# Patient Record
Sex: Female | Born: 1955 | Hispanic: No | Marital: Married | Smoking: Never smoker
Health system: Southern US, Community
[De-identification: ages and names within clinical notes are randomized; demographics above are authoritative.]

## PROBLEM LIST (undated history)

## (undated) DIAGNOSIS — D6859 Other primary thrombophilia: Secondary | ICD-10-CM

## (undated) HISTORY — PX: DILATION AND CURETTAGE OF UTERUS: SHX78

## (undated) HISTORY — DX: Other primary thrombophilia: D68.59

---

## 2012-07-05 ENCOUNTER — Emergency Department (HOSPITAL_BASED_OUTPATIENT_CLINIC_OR_DEPARTMENT_OTHER): Payer: BC Managed Care – PPO

## 2012-07-05 ENCOUNTER — Emergency Department (HOSPITAL_BASED_OUTPATIENT_CLINIC_OR_DEPARTMENT_OTHER)
Admission: EM | Admit: 2012-07-05 | Discharge: 2012-07-05 | Disposition: A | Discharge status: Home / Self Care | Payer: BC Managed Care – PPO | Attending: Emergency Medicine | Admitting: Emergency Medicine

## 2012-07-05 DIAGNOSIS — Y92410 Unspecified street and highway as the place of occurrence of the external cause: Secondary | ICD-10-CM | POA: Insufficient documentation

## 2012-07-05 DIAGNOSIS — S0083XA Contusion of other part of head, initial encounter: Secondary | ICD-10-CM

## 2012-07-05 DIAGNOSIS — S8990XA Unspecified injury of unspecified lower leg, initial encounter: Secondary | ICD-10-CM | POA: Insufficient documentation

## 2012-07-05 DIAGNOSIS — S0003XA Contusion of scalp, initial encounter: Secondary | ICD-10-CM | POA: Insufficient documentation

## 2012-07-05 DIAGNOSIS — Y9389 Activity, other specified: Secondary | ICD-10-CM | POA: Insufficient documentation

## 2012-07-05 MED ORDER — CYCLOBENZAPRINE HCL 10 MG PO TABS: 10.0000 mg | ORAL_TABLET | Freq: Two times a day (BID) | ORAL | Status: DC | PRN

## 2012-07-05 MED ORDER — IBUPROFEN 400 MG PO TABS
600.0000 mg | ORAL_TABLET | Freq: Once | ORAL | Status: AC
  Administered 2012-07-05: 600 mg via ORAL
  Filled 2012-07-05: qty 1
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
## 2016-04-21 HISTORY — PX: COLONOSCOPY: SHX174

## 2016-04-25 ENCOUNTER — Encounter: Payer: Self-pay | Admitting: Family Medicine

## 2016-04-25 ENCOUNTER — Ambulatory Visit (INDEPENDENT_AMBULATORY_CARE_PROVIDER_SITE_OTHER): Payer: BC Managed Care – PPO | Admitting: Family Medicine

## 2016-04-25 VITALS — BP 126/79 | HR 64 | Temp 98.0°F | Resp 20 | Ht 61.0 in | Wt 152.8 lb

## 2016-04-25 DIAGNOSIS — Z1159 Encounter for screening for other viral diseases: Secondary | ICD-10-CM | POA: Diagnosis not present

## 2016-04-25 DIAGNOSIS — Z1329 Encounter for screening for other suspected endocrine disorder: Secondary | ICD-10-CM

## 2016-04-25 DIAGNOSIS — Z6828 Body mass index (BMI) 28.0-28.9, adult: Secondary | ICD-10-CM

## 2016-04-25 DIAGNOSIS — Z130 Encounter for screening for diseases of the blood and blood-forming organs and certain disorders involving the immune mechanism: Secondary | ICD-10-CM | POA: Diagnosis not present

## 2016-04-25 DIAGNOSIS — Z131 Encounter for screening for diabetes mellitus: Secondary | ICD-10-CM | POA: Diagnosis not present

## 2016-04-25 DIAGNOSIS — Z114 Encounter for screening for human immunodeficiency virus [HIV]: Secondary | ICD-10-CM

## 2016-04-25 DIAGNOSIS — Z1231 Encounter for screening mammogram for malignant neoplasm of breast: Secondary | ICD-10-CM | POA: Diagnosis not present

## 2016-04-25 DIAGNOSIS — Z0000 Encounter for general adult medical examination without abnormal findings: Secondary | ICD-10-CM

## 2016-04-25 DIAGNOSIS — Z13220 Encounter for screening for lipoid disorders: Secondary | ICD-10-CM | POA: Diagnosis not present

## 2016-04-25 DIAGNOSIS — Z1211 Encounter for screening for malignant neoplasm of colon: Secondary | ICD-10-CM

## 2016-04-25 DIAGNOSIS — Z7689 Persons encountering health services in other specified circumstances: Secondary | ICD-10-CM

## 2016-04-25 DIAGNOSIS — Z1321 Encounter for screening for nutritional disorder: Secondary | ICD-10-CM

## 2016-04-25 DIAGNOSIS — Z23 Encounter for immunization: Secondary | ICD-10-CM | POA: Diagnosis not present

## 2016-04-25 HISTORY — DX: Encounter for general adult medical examination without abnormal findings: Z00.00

## 2016-04-25 LAB — CBC WITH DIFFERENTIAL/PLATELET
BASOS ABS: 0.1 10*3/uL (ref 0.0–0.1)
BASOS PCT: 1.1 % (ref 0.0–3.0)
EOS ABS: 0.5 10*3/uL (ref 0.0–0.7)
Eosinophils Relative: 8.5 % — ABNORMAL HIGH (ref 0.0–5.0)
HEMATOCRIT: 47.1 % — AB (ref 36.0–46.0)
Hemoglobin: 15.8 g/dL — ABNORMAL HIGH (ref 12.0–15.0)
LYMPHS PCT: 35.6 % (ref 12.0–46.0)
Lymphs Abs: 2.1 10*3/uL (ref 0.7–4.0)
MCHC: 33.6 g/dL (ref 30.0–36.0)
MCV: 89.9 fl (ref 78.0–100.0)
MONO ABS: 0.4 10*3/uL (ref 0.1–1.0)
Monocytes Relative: 7.1 % (ref 3.0–12.0)
NEUTROS ABS: 2.9 10*3/uL (ref 1.4–7.7)
NEUTROS PCT: 47.7 % (ref 43.0–77.0)
PLATELETS: 363 10*3/uL (ref 150.0–400.0)
RBC: 5.24 Mil/uL — ABNORMAL HIGH (ref 3.87–5.11)
RDW: 14.4 % (ref 11.5–15.5)
WBC: 6 10*3/uL (ref 4.0–10.5)

## 2016-04-25 LAB — VITAMIN D 25 HYDROXY (VIT D DEFICIENCY, FRACTURES): VITD: 17.67 ng/mL — ABNORMAL LOW (ref 30.00–100.00)

## 2016-04-25 LAB — COMPREHENSIVE METABOLIC PANEL
ALK PHOS: 60 U/L (ref 39–117)
ALT: 17 U/L (ref 0–35)
AST: 17 U/L (ref 0–37)
Albumin: 4.4 g/dL (ref 3.5–5.2)
BUN: 16 mg/dL (ref 6–23)
CHLORIDE: 106 meq/L (ref 96–112)
CO2: 31 meq/L (ref 19–32)
Calcium: 9.5 mg/dL (ref 8.4–10.5)
Creatinine, Ser: 0.82 mg/dL (ref 0.40–1.20)
GFR: 75.52 mL/min (ref >60.00)
GLUCOSE: 99 mg/dL (ref 70–99)
POTASSIUM: 5.2 meq/L — AB (ref 3.5–5.1)
SODIUM: 142 meq/L (ref 135–145)
TOTAL PROTEIN: 7.5 g/dL (ref 6.0–8.3)
Total Bilirubin: 0.7 mg/dL (ref 0.2–1.2)

## 2016-04-25 LAB — LIPID PANEL
Cholesterol: 193 mg/dL (ref 0–200)
HDL: 73.7 mg/dL (ref >39.00)
LDL CALC: 100 mg/dL — AB (ref 0–99)
NONHDL: 118.96
Total CHOL/HDL Ratio: 3
Triglycerides: 95 mg/dL (ref 0.0–149.0)
VLDL: 19 mg/dL (ref 0.0–40.0)

## 2016-04-25 LAB — HIV ANTIBODY (ROUTINE TESTING W REFLEX): HIV: NONREACTIVE

## 2016-04-25 LAB — HEMOGLOBIN A1C: Hgb A1c MFr Bld: 5.6 % (ref 4.6–6.5)

## 2016-04-25 LAB — TSH: TSH: 0.89 u[IU]/mL (ref 0.35–4.50)

## 2016-04-25 MED ORDER — ZOSTER VACCINE LIVE 19400 UNT/0.65ML ~~LOC~~ SUSR: 0.6500 mL | Freq: Once | SUBCUTANEOUS | 0 refills | Status: AC

## 2016-04-25 NOTE — Progress Notes (Signed)
Patient ID: Jaime Michael, female  DOB: Apr 18, 1956, 61 y.o.   MRN: [id number] Patient Care Team    Relationship Specialty Notifications Start End  Ma Hillock, DO PCP - General Family Medicine  04/25/16     Subjective:  Jaime Michael is a 61 y.o.  female present for new patient establishment. All past medical history, surgical history, allergies, family history, immunizations, medications and social history were Obtained and entered in the electronic medical record today. All recent labs, ED visits and hospitalizations within the last year were reviewed. Patient needs to establish care, she has not been seen by Dr. in over 10 years.  Health maintenance:  Colonoscopy: No family history, she thinks she has had a colonoscopy maybe 10 years ago, but she does not recall the location. Mammogram: No family history. No prior screening. Cervical cancer screening: No prior screening since she had children. Immunizations: tdap  administered today, Influenza declined (encouraged yearly),  zostavax prescription printed for her today. Infectious disease screening: HIV and Hep C completed today. DEXA: never Assistive device: None Oxygen use: None Patient has a Dental home. Hospitalizations/ED visits: None  Immunization History  Administered Date(s) Administered  . Tdap 04/25/2016     History reviewed. No pertinent past medical history. No Known Allergies Past Surgical History:  Procedure Laterality Date  . CESAREAN SECTION     Family History  Problem Relation Age of Onset  . Stomach cancer Father    Social History   Social History  . Marital status: Married    Spouse name: N/A  . Number of children: N/A  . Years of education: N/A   Occupational History  . Not on file.   Social History Main Topics  . Smoking status: Never Smoker  . Smokeless tobacco: Never Used  . Alcohol use No  . Drug use: No  . Sexual activity: No   Other Topics Concern  . Not on file   Social  History Narrative  . No narrative on file   Allergies as of 04/25/2016   No Known Allergies     Medication List       Accurate as of 04/25/16  8:07 PM. Always use your most recent med list.          Zoster Vaccine Live (PF) 19400 UNT/0.65ML injection Commonly known as:  ZOSTAVAX Inject 19,400 Units into the skin once.        Recent Results (from the past 2160 hour(s))  CBC w/Diff     Status: Abnormal   Collection Time: 04/25/16 10:00 AM  Result Value Ref Range   WBC 6.0 4.0 - 10.5 K/uL   RBC 5.24 (H) 3.87 - 5.11 Mil/uL   Hemoglobin 15.8 (H) 12.0 - 15.0 g/dL   HCT 47.1 (H) 36.0 - 46.0 %   MCV 89.9 78.0 - 100.0 fl   MCHC 33.6 30.0 - 36.0 g/dL   RDW 14.4 11.5 - 15.5 %   Platelets 363.0 150.0 - 400.0 K/uL   Neutrophils Relative % 47.7 43.0 - 77.0 %   Lymphocytes Relative 35.6 12.0 - 46.0 %   Monocytes Relative 7.1 3.0 - 12.0 %   Eosinophils Relative 8.5 (H) 0.0 - 5.0 %   Basophils Relative 1.1 0.0 - 3.0 %   Neutro Abs 2.9 1.4 - 7.7 K/uL   Lymphs Abs 2.1 0.7 - 4.0 K/uL   Monocytes Absolute 0.4 0.1 - 1.0 K/uL   Eosinophils Absolute 0.5 0.0 - 0.7 K/uL   Basophils Absolute 0.1  0.0 - 0.1 K/uL  Comp Met (CMET)     Status: Abnormal   Collection Time: 04/25/16 10:00 AM  Result Value Ref Range   Sodium 142 135 - 145 mEq/L   Potassium 5.2 (H) 3.5 - 5.1 mEq/L   Chloride 106 96 - 112 mEq/L   CO2 31 19 - 32 mEq/L   Glucose, Bld 99 70 - 99 mg/dL   BUN 16 6 - 23 mg/dL   Creatinine, Ser 0.82 0.40 - 1.20 mg/dL   Total Bilirubin 0.7 0.2 - 1.2 mg/dL   Alkaline Phosphatase 60 39 - 117 U/L   AST 17 0 - 37 U/L   ALT 17 0 - 35 U/L   Total Protein 7.5 6.0 - 8.3 g/dL   Albumin 4.4 3.5 - 5.2 g/dL   Calcium 9.5 8.4 - 10.5 mg/dL   GFR 75.52 >60.00 mL/min  Lipid panel     Status: Abnormal   Collection Time: 04/25/16 10:00 AM  Result Value Ref Range   Cholesterol 193 0 - 200 mg/dL    Comment: ATP III Classification       Desirable:  < 200 mg/dL               Borderline High:  200 - 239  mg/dL          High:  > = 240 mg/dL   Triglycerides 95.0 0.0 - 149.0 mg/dL    Comment: Normal:  <150 mg/dLBorderline High:  150 - 199 mg/dL   HDL 73.70 >39.00 mg/dL   VLDL 19.0 0.0 - 40.0 mg/dL   LDL Cholesterol 100 (H) 0 - 99 mg/dL   Total CHOL/HDL Ratio 3     Comment:                Men          Women1/2 Average Risk     3.4          3.3Average Risk          5.0          4.42X Average Risk          9.6          7.13X Average Risk          15.0          11.0                       NonHDL 118.96     Comment: NOTE:  Non-HDL goal should be 30 mg/dL higher than patient's LDL goal (i.e. LDL goal of < 70 mg/dL, would have non-HDL goal of < 100 mg/dL)  TSH     Status: None   Collection Time: 04/25/16 10:00 AM  Result Value Ref Range   TSH 0.89 0.35 - 4.50 uIU/mL  HgB A1c     Status: None   Collection Time: 04/25/16 10:00 AM  Result Value Ref Range   Hgb A1c MFr Bld 5.6 4.6 - 6.5 %    Comment: Glycemic Control Guidelines for People with Diabetes:Non Diabetic:  <6%Goal of Therapy: <7%Additional Action Suggested:  >8%   Vitamin D (25 hydroxy)     Status: Abnormal   Collection Time: 04/25/16 10:00 AM  Result Value Ref Range   VITD 17.67 (L) 30.00 - 100.00 ng/mL    ROS: 14 pt review of systems performed and negative (unless mentioned in an HPI)  Objective: BP 126/79 (BP Location: Right Arm, Patient Position: Sitting, Cuff Size:  Normal)   Pulse 64   Temp 98 F (36.7 C)   Resp 20   Ht '5\' 1"'  (1.549 m)   Wt 152 lb 12 oz (69.3 kg)   SpO2 99%   BMI 28.86 kg/m  Gen: Afebrile. No acute distress. Nontoxic in appearance, well-developed, well-nourished,  well-developed, well-nourished female. HENT: AT. [place]. Bilateral TM visualized and normal in appearance, normal external auditory canal. MMM, no oral lesions, adequate dentition. Bilateral nares within normal limits. Throat without erythema, ulcerations or exudates. No Cough on exam, no hoarseness on exam. Eyes:Pupils Equal Round Reactive to light,  Extraocular movements intact,  Conjunctiva without redness, discharge or icterus. Neck/lymp/endocrine: Supple, no lymphadenopathy, no thyromegaly CV: RRR no murmur appreciated, no edema, +2/4 P posterior tibialis pulses. No carotid bruits. No JVD. Chest: CTAB, no wheeze, rhonchi or crackles. Normal Respiratory effort. Good Air movement. Abd: Soft. Round. NTND. BS present. No Masses palpated. No hepatosplenomegaly. No rebound tenderness or guarding. Skin: No rashes, purpura or petechiae. Warm and well-perfused. Skin intact. Neuro/Msk:  Normal gait. PERLA. EOMi. Alert. Oriented x3.  Cranial nerves II through XII intact. Muscle strength 5/5 upper/lower extremity. DTRs equal bilaterally. Psych: Normal affect, dress and demeanor. Normal speech. Normal thought content and judgment.   Assessment/plan: Dhruvi Crenshaw is a 61 y.o. female present for establish of care with CPE. Encounter to establish care with new doctor Encounter for preventive health examination Patient was encouraged to exercise greater than 150 minutes a week. Patient was encouraged to choose a diet filled with fresh fruits and vegetables, and lean meats. AVS provided to patient today for education/recommendation on gender specific health and safety maintenance. Colonoscopy: No family history, she thinks she has had a colonoscopy maybe 10 years ago, but she does not recall the location. Order placed today. Mammogram: No family history. No prior screening. Cervical cancer screening: No prior screening since she had children. If patient desires PCP to complete Pap smear, she will make an appointment. Otherwise she can establish with gynecology. Immunizations: tdap  administered today, Influenza declined (encouraged yearly),  zostavax prescription printed for her today. Infectious disease screening: HIV and Hep C completed today. DEXA: never Screening for iron deficiency anemia - CBC w/Diff Need for hepatitis C screening test -  Hepatitis C Antibody Routine health maintenance - Comp Met (CMET) Lipid screening - Lipid panel Encounter for vitamin deficiency screening - Vitamin D (25 hydroxy) Screening for thyroid disorder - TSH Screening for diabetes mellitus - HgB A1c Need for diphtheria-tetanus-pertussis (Tdap) vaccine - Tdap vaccine greater than or equal to 7yo IM Encounter for screening for HIV - HIV antibody (with reflex BMI 28.0-28.9,adult - Lipid panel - TSH - HgB A1c  Patient will follow up yearly for physical, if labs indicate she may be asked to come back for follow-up.  Electronically signed by: Howard Pouch, DO [place]

## 2016-04-25 NOTE — Patient Instructions (Signed)
Vit D 800 u a day Calcium 1200 mg a day babay asa 81 mg a day   Health Maintenance, Female Introduction Adopting a healthy lifestyle and getting preventive care can go a long way to promote health and wellness. Talk with your health care provider about what schedule of regular examinations is right for you. This is a good chance for you to check in with your provider about disease prevention and staying healthy. In between checkups, there are plenty of things you can do on your own. Experts have done a lot of research about which lifestyle changes and preventive measures are most likely to keep you healthy. Ask your health care provider for more information. Weight and diet Eat a healthy diet  Be sure to include plenty of vegetables, fruits, low-fat dairy products, and lean protein.  Do not eat a lot of foods high in solid fats, added sugars, or salt.  Get regular exercise. This is one of the most important things you can do for your health.  Most adults should exercise for at least 150 minutes each week. The exercise should increase your heart rate and make you sweat (moderate-intensity exercise).  Most adults should also do strengthening exercises at least twice a week. This is in addition to the moderate-intensity exercise. Maintain a healthy weight  Body mass index (BMI) is a measurement that can be used to identify possible weight problems. It estimates body fat based on height and weight. Your health care provider can help determine your BMI and help you achieve or maintain a healthy weight.  For females 75 years of age and older:  A BMI below 18.5 is considered underweight.  A BMI of 18.5 to 24.9 is normal.  A BMI of 25 to 29.9 is considered overweight.  A BMI of 30 and above is considered obese. Watch levels of cholesterol and blood lipids  You should start having your blood tested for lipids and cholesterol at 61 years of age, then have this test every 5 years.  You may  need to have your cholesterol levels checked more often if:  Your lipid or cholesterol levels are high.  You are older than 61 years of age.  You are at high risk for heart disease. Cancer screening Lung Cancer  Lung cancer screening is recommended for adults 69-83 years old who are at high risk for lung cancer because of a history of smoking.  A yearly low-dose CT scan of the lungs is recommended for people who:  Currently smoke.  Have quit within the past 15 years.  Have at least a 30-pack-year history of smoking. A pack year is smoking an average of one pack of cigarettes a day for 1 year.  Yearly screening should continue until it has been 15 years since you quit.  Yearly screening should stop if you develop a health problem that would prevent you from having lung cancer treatment. Breast Cancer  Practice breast self-awareness. This means understanding how your breasts normally appear and feel.  It also means doing regular breast self-exams. Let your health care provider know about any changes, no matter how small.  If you are in your 20s or 30s, you should have a clinical breast exam (CBE) by a health care provider every 1-3 years as part of a regular health exam.  If you are 82 or older, have a CBE every year. Also consider having a breast X-ray (mammogram) every year.  If you have a family history of breast cancer,  talk to your health care provider about genetic screening.  If you are at high risk for breast cancer, talk to your health care provider about having an MRI and a mammogram every year.  Breast cancer gene (BRCA) assessment is recommended for women who have family members with BRCA-related cancers. BRCA-related cancers include:  Breast.  Ovarian.  Tubal.  Peritoneal cancers.  Results of the assessment will determine the need for genetic counseling and BRCA1 and BRCA2 testing. Cervical Cancer  Your health care provider may recommend that you be  screened regularly for cancer of the pelvic organs (ovaries, uterus, and vagina). This screening involves a pelvic examination, including checking for microscopic changes to the surface of your cervix (Pap test). You may be encouraged to have this screening done every 3 years, beginning at age 79.  For women ages 48-65, health care providers may recommend pelvic exams and Pap testing every 3 years, or they may recommend the Pap and pelvic exam, combined with testing for human papilloma virus (HPV), every 5 years. Some types of HPV increase your risk of cervical cancer. Testing for HPV may also be done on women of any age with unclear Pap test results.  Other health care providers may not recommend any screening for nonpregnant women who are considered low risk for pelvic cancer and who do not have symptoms. Ask your health care provider if a screening pelvic exam is right for you.  If you have had past treatment for cervical cancer or a condition that could lead to cancer, you need Pap tests and screening for cancer for at least 20 years after your treatment. If Pap tests have been discontinued, your risk factors (such as having a new sexual partner) need to be reassessed to determine if screening should resume. Some women have medical problems that increase the chance of getting cervical cancer. In these cases, your health care provider may recommend more frequent screening and Pap tests. Colorectal Cancer  This type of cancer can be detected and often prevented.  Routine colorectal cancer screening usually begins at 61 years of age and continues through 61 years of age.  Your health care provider may recommend screening at an earlier age if you have risk factors for colon cancer.  Your health care provider may also recommend using home test kits to check for hidden blood in the stool.  A small camera at the end of a tube can be used to examine your colon directly (sigmoidoscopy or colonoscopy).  This is done to check for the earliest forms of colorectal cancer.  Routine screening usually begins at age 33.  Direct examination of the colon should be repeated every 5-10 years through 61 years of age. However, you may need to be screened more often if early forms of precancerous polyps or small growths are found. Skin Cancer  Check your skin from head to toe regularly.  Tell your health care provider about any new moles or changes in moles, especially if there is a change in a mole's shape or color.  Also tell your health care provider if you have a mole that is larger than the size of a pencil eraser.  Always use sunscreen. Apply sunscreen liberally and repeatedly throughout the day.  Protect yourself by wearing long sleeves, pants, a wide-brimmed hat, and sunglasses whenever you are outside. Heart disease, diabetes, and high blood pressure  High blood pressure causes heart disease and increases the risk of stroke. High blood pressure is more likely to  develop in:  People who have blood pressure in the high end of the normal range (130-139/85-89 mm Hg).  People who are overweight or obese.  People who are African American.  If you are 61-16 years of age, have your blood pressure checked every 3-5 years. If you are 38 years of age or older, have your blood pressure checked every year. You should have your blood pressure measured twice-once when you are at a hospital or clinic, and once when you are not at a hospital or clinic. Record the average of the two measurements. To check your blood pressure when you are not at a hospital or clinic, you can use:  An automated blood pressure machine at a pharmacy.  A home blood pressure monitor.  If you are between 60 years and 42 years old, ask your health care provider if you should take aspirin to prevent strokes.  Have regular diabetes screenings. This involves taking a blood sample to check your fasting blood sugar level.  If you  are at a normal weight and have a low risk for diabetes, have this test once every three years after 61 years of age.  If you are overweight and have a high risk for diabetes, consider being tested at a younger age or more often. Preventing infection Hepatitis B  If you have a higher risk for hepatitis B, you should be screened for this virus. You are considered at high risk for hepatitis B if:  You were born in a country where hepatitis B is common. Ask your health care provider which countries are considered high risk.  Your parents were born in a high-risk country, and you have not been immunized against hepatitis B (hepatitis B vaccine).  You have HIV or AIDS.  You use needles to inject street drugs.  You live with someone who has hepatitis B.  You have had sex with someone who has hepatitis B.  You get hemodialysis treatment.  You take certain medicines for conditions, including cancer, organ transplantation, and autoimmune conditions. Hepatitis C  Blood testing is recommended for:  Everyone born from [birth year] through 1965.  Anyone with known risk factors for hepatitis C. Sexually transmitted infections (STIs)  You should be screened for sexually transmitted infections (STIs) including gonorrhea and chlamydia if:  You are sexually active and are younger than 61 years of age.  You are older than 61 years of age and your health care provider tells you that you are at risk for this type of infection.  Your sexual activity has changed since you were last screened and you are at an increased risk for chlamydia or gonorrhea. Ask your health care provider if you are at risk.  If you do not have HIV, but are at risk, it may be recommended that you take a prescription medicine daily to prevent HIV infection. This is called pre-exposure prophylaxis (PrEP). You are considered at risk if:  You are sexually active and do not regularly use condoms or know the HIV status of your  partner(s).  You take drugs by injection.  You are sexually active with a partner who has HIV. Talk with your health care provider about whether you are at high risk of being infected with HIV. If you choose to begin PrEP, you should first be tested for HIV. You should then be tested every 3 months for as long as you are taking PrEP. Pregnancy  If you are premenopausal and you may become pregnant, ask your health  care provider about preconception counseling.  If you may become pregnant, take 400 to 800 micrograms (mcg) of folic acid every day.  If you want to prevent pregnancy, talk to your health care provider about birth control (contraception). Osteoporosis and menopause  Osteoporosis is a disease in which the bones lose minerals and strength with aging. This can result in serious bone fractures. Your risk for osteoporosis can be identified using a bone density scan.  If you are 58 years of age or older, or if you are at risk for osteoporosis and fractures, ask your health care provider if you should be screened.  Ask your health care provider whether you should take a calcium or vitamin D supplement to lower your risk for osteoporosis.  Menopause may have certain physical symptoms and risks.  Hormone replacement therapy may reduce some of these symptoms and risks. Talk to your health care provider about whether hormone replacement therapy is right for you. Follow these instructions at home:  Schedule regular health, dental, and eye exams.  Stay current with your immunizations.  Do not use any tobacco products including cigarettes, chewing tobacco, or electronic cigarettes.  If you are pregnant, do not drink alcohol.  If you are breastfeeding, limit how much and how often you drink alcohol.  Limit alcohol intake to no more than 1 drink per day for nonpregnant women. One drink equals 12 ounces of beer, 5 ounces of wine, or 1 ounces of hard liquor.  Do not use street  drugs.  Do not share needles.  Ask your health care provider for help if you need support or information about quitting drugs.  Tell your health care provider if you often feel depressed.  Tell your health care provider if you have ever been abused or do not feel safe at home. This information is not intended to replace advice given to you by your health care provider. Make sure you discuss any questions you have with your health care provider. Document Released: 10/21/2010 Document Revised: 09/13/2015 Document Reviewed: 01/09/2015  2017 Elsevier   Please help [place] help you:  It is a privilege to be able to take care of great patients such as yourself. We are honored you have chosen [facility] for your Primary Care home. Below you will find basic instructions that you may need to access in the future. Please help [place] help you by reading the instructions, which cover many of the frequent questions we experience.   Prescription refills and request:  -In order to allow more efficient response time, please call your pharmacy for all refills. They will forward the request electronically to [place]. This allows for the quickest possible response. Request left on a nurse line can take longer to refill, since these are checked as time allows between office patients and other phone calls.  - refill request can take up to 3-5 working days to complete.  - If request is sent electronically and request is appropiate, it is usually completed in 1-2 business days.  - all patients will need to be seen routinely for all chronic medical conditions requiring prescription medications (see follow-up below). If you are overdue for follow up on your condition, you will be asked to make an appointment and we will call in enough medication to cover you until your appointment (up to 30 days).  - all controlled substances will require a face to face visit to request/refill.  - if you desire your prescriptions to go  through a new  pharmacy, and have an active script at original pharmacy, you will need to call your pharmacy and have scripts transferred to new pharmacy. This is completed between the pharmacy locations and not by your provider.    Results: If any images or labs were ordered, it can take up to 1 week to get results depending on the test ordered and the lab/facility running and resulting the test. - Normal or stable results, which do not need further discussion, will be released to your mychart immediately with attached note to you. A call will not be generated for normal results. Please make certain to sign up for mychart. If you have questions on how to activate your mychart you can call the front office.  - If your results need further discussion, our office will attempt to contact you via phone, and if unable to reach you after 2 attempts, we will release your abnormal result to your mychart with instructions.  - All results will be automatically released in mychart after 1 week.  - Your provider will provide you with explanation and instruction on all relevant material in your results. Please keep in mind, results and labs may appear confusing or abnormal to the untrained eye, but it does not mean they are actually abnormal for you personally. If you have any questions about your results that are not covered, or you desire more detailed explanation than what was provided, you should make an appointment with your provider to do so.   Our office handles many outgoing and incoming calls daily. If we have not contacted you within 1 week about your results, please check your mychart to see if there is a message first and if not, then contact our office.  In helping with this matter, you help decrease call volume, and therefore allow [place] to be able to respond to patients needs more efficiently.   Acute office visits (sick visit):  An acute visit is intended for a new problem and are scheduled in shorter  time slots to allow schedule openings for patients with new problems. This is the appropriate visit to discuss a new problem. In order to provide you with excellent quality medical care with proper time for you to explain your problem, have an exam and receive treatment with instructions, these appointments should be limited to one new problem per visit. If you experience a new problem, in which you desire to be addressed, please make an acute office visit, we save openings on the schedule to accommodate you. Please do not save your new problem for any other type of visit, let us take care of it properly and quickly for you.   Follow up visits:  Depending on your condition(s) your provider will need to see you routinely in order to provide you with quality care and prescribe medication(s). Most chronic conditions (Example: hypertension, Diabetes, depression/anxiety... etc), require visits a couple times a year. Your provider will instruct you on proper follow up for your personal medical conditions and history. Please make certain to make follow up appointments for your condition as instructed. Failing to do so could result in lapse in your medication treatment/refills. If you request a refill, and are overdue to be seen on a condition, we will always provide you with a 30 day script (once) to allow you time to schedule.    Medicare wellness (well visit): - we have a wonderful Nurse Selena Batten), that will meet with you and provide you will yearly medicare wellness visits. These visits  should occur yearly (can not be scheduled less than 1 calendar year apart) and cover preventive health, immunizations, advance directives and screenings you are entitled to yearly through your medicare benefits. Do not miss out on your entitled benefits, this is when medicare will pay for these benefits to be ordered for you.  These are strongly encouraged by your provider and is the appropriate type of visit to make certain you are  up to date with all preventive health benefits. If you have not had your medicare wellness exam in the last 12 months, please make certain to schedule one by calling the office and schedule your medicare wellness with Maudie Mercury as soon as possible.   Yearly physical (well visit):  - Adults are recommended to be seen yearly for physicals. Check with your insurance and date of your last physical, most insurances require one calendar year between physicals. Physicals include all preventive health topics, screenings, medical exam and labs that are appropriate for gender/age and history. You may have fasting labs needed at this visit. This is a well visit (not a sick visit), acute topics should not be covered during this visit.  - Pediatric patients are seen more frequently when they are younger. Your provider will advise you on well child visit timing that is appropriate for your their age. - This is not a medicare wellness visit. Medicare wellness exams do not have an exam portion to the visit. Some medicare companies allow for a physical, some do not allow a yearly physical. If your medicare allows a yearly physical you can schedule the medicare wellness with our nurse Maudie Mercury and have your physical with your provider after, on the same day. Please check with insurance for your full benefits.   Late Policy/No Shows:  - all new patients should arrive 15-30 minutes earlier than appointment to allow [place] time  to  obtain all personal demographics,  insurance information and for you to complete office paperwork. - All established patients should arrive 10-15 minutes earlier than appointment time to update all information and be checked in .  - In our best efforts to run on time, if you are late for your appointment you will be asked to either reschedule or if able, we will work you back into the schedule. There will be a wait time to work you back in the schedule,  depending on availability.  - If you are unable to make it  to your appointment as scheduled, please call 24 hours ahead of time to allow [place] to fill the time slot with someone else who needs to be seen. If you do not cancel your appointment ahead of time, you may be charged a no show fee.

## 2016-04-26 LAB — HEPATITIS C ANTIBODY: HCV AB: NEGATIVE

## 2016-04-28 ENCOUNTER — Telehealth: Payer: Self-pay | Admitting: Family Medicine

## 2016-04-28 ENCOUNTER — Encounter: Payer: Self-pay | Admitting: Gastroenterology

## 2016-04-28 DIAGNOSIS — E559 Vitamin D deficiency, unspecified: Secondary | ICD-10-CM | POA: Insufficient documentation

## 2016-04-28 HISTORY — DX: Vitamin D deficiency, unspecified: E55.9

## 2016-04-28 MED ORDER — VITAMIN D (ERGOCALCIFEROL) 1.25 MG (50000 UNIT) PO CAPS: 50000.0000 [IU] | ORAL_CAPSULE | ORAL | 0 refills | Status: DC

## 2016-04-28 NOTE — Telephone Encounter (Signed)
Please call pt: - her labs resulted and are normal with the exception of rather low vit. D. I have called in a supplement for her to take once weekly for 12 weeks and then she will need to retest. After supplement she should take daily vit d of 1000 u daily (since she is low). Future lab placed, lab appt only needed.

## 2016-04-28 NOTE — Telephone Encounter (Signed)
Spoke with patient reviewed lab results and instructions. Patient verbalized understanding. 

## 2016-05-12 ENCOUNTER — Encounter: Payer: Self-pay | Admitting: Family Medicine

## 2016-05-12 ENCOUNTER — Ambulatory Visit (INDEPENDENT_AMBULATORY_CARE_PROVIDER_SITE_OTHER): Payer: BC Managed Care – PPO | Admitting: Family Medicine

## 2016-05-12 ENCOUNTER — Other Ambulatory Visit (HOSPITAL_COMMUNITY)
Admission: RE | Admit: 2016-05-12 | Discharge: 2016-05-12 | Disposition: A | Discharge status: Home / Self Care | Payer: BC Managed Care – PPO | Source: Ambulatory Visit | Attending: Family Medicine | Admitting: Family Medicine

## 2016-05-12 VITALS — BP 131/76 | HR 76 | Temp 97.4°F | Resp 20 | Ht 61.0 in | Wt 154.5 lb

## 2016-05-12 DIAGNOSIS — Z01419 Encounter for gynecological examination (general) (routine) without abnormal findings: Secondary | ICD-10-CM

## 2016-05-12 DIAGNOSIS — Z780 Asymptomatic menopausal state: Secondary | ICD-10-CM | POA: Insufficient documentation

## 2016-05-12 DIAGNOSIS — Z1239 Encounter for other screening for malignant neoplasm of breast: Secondary | ICD-10-CM

## 2016-05-12 DIAGNOSIS — E2839 Other primary ovarian failure: Secondary | ICD-10-CM

## 2016-05-12 DIAGNOSIS — E559 Vitamin D deficiency, unspecified: Secondary | ICD-10-CM | POA: Diagnosis not present

## 2016-05-12 DIAGNOSIS — Z124 Encounter for screening for malignant neoplasm of cervix: Secondary | ICD-10-CM

## 2016-05-12 DIAGNOSIS — Z1151 Encounter for screening for human papillomavirus (HPV): Secondary | ICD-10-CM | POA: Diagnosis not present

## 2016-05-12 DIAGNOSIS — Z1231 Encounter for screening mammogram for malignant neoplasm of breast: Secondary | ICD-10-CM

## 2016-05-12 HISTORY — DX: Asymptomatic menopausal state: Z78.0

## 2016-05-12 MED ORDER — VITAMIN D (ERGOCALCIFEROL) 1.25 MG (50000 UNIT) PO CAPS: 50000.0000 [IU] | ORAL_CAPSULE | ORAL | 0 refills | Status: DC

## 2016-05-12 NOTE — Progress Notes (Signed)
Jaime Michael, Welby Apr 11, 1956, 61 y.o., female MRN: [id number] Patient Care Team    Relationship Specialty Notifications Start End  Ma Hillock, DO PCP - General Family Medicine  04/25/16     CC: Multiple concerns.  Subjective: Patient presents today for her cervical cancer screening, vit d deficiency, bone density screening.  All past medical history, surgical history, allergies, family history, immunizations and social history was obtained from the patient today and entered into the electronic medical record.   Pt is postmenopausal. She has not had a PAP smear since her children were born, She does not believe she has ever had an abnormal PAP. She is married, monogamous relationship. She has never had a mammogram. She doe snot perform SBE. She denies vaginal discharge, irritation, dyspareunia, dysuria or breast pain.   Vitamin D deficiency: Pt is vitamin D deficient. She had not realized she was to take the OTC and the prescribed supplement. She has only been taking the OTC 800 u daily. She is postmenopausal. She has never had a bone density scan.   No Known Allergies Social History  Substance Use Topics  . Smoking status: Never Smoker  . Smokeless tobacco: Never Used  . Alcohol use No   History reviewed. No pertinent past medical history. Past Surgical History:  Procedure Laterality Date  . CESAREAN SECTION     Family History  Problem Relation Age of Onset  . Stomach cancer Father    Allergies as of 05/12/2016   No Known Allergies     Medication List       Accurate as of 05/12/16  8:26 AM. Always use your most recent med list.          Vitamin D (Ergocalciferol) 50000 units Caps capsule Commonly known as:  DRISDOL Take 1 capsule (50,000 Units total) by mouth every 7 (seven) days.       No results found for this or any previous visit (from the past 24 hour(s)). No results found.   ROS: Negative, with the exception of above mentioned in HPI   Objective:  BP  131/76 (BP Location: Left Arm, Patient Position: Sitting, Cuff Size: Normal)   Pulse 76   Temp 97.4 F (36.3 C)   Resp 20   Ht 5\' 1"  (1.549 m)   Wt 154 lb 8 oz (70.1 kg)   SpO2 99%   BMI 29.19 kg/m  Body mass index is 29.19 kg/m. Gen: Afebrile. No acute distress. Nontoxic in appearance, well developed, well nourished.  HENT: AT. Trainer. MMM Eyes:Pupils Equal Round Reactive to light, Extraocular movements intact,  Conjunctiva without redness, discharge or icterus. Neck/lymp/endocrine: Supple, CV: RRR , no edema Chest: CTAB, no wheeze or crackles. Good air movement, normal resp effort.  Abd: Soft. round. NTND. BS present. no Masses palpated. No rebound or guarding.  Neuro: Normal gait. PERLA. EOMi. Alert. Oriented x3  Breasts: breasts appear normal, symmetrical, no tenderness on exam, no suspicious masses, no skin or nipple changes or axillary nodes. GYN:  External genitalia within normal limits for age, normal hair distribution, no lesions. Urethral meatus normal, no lesions. Vaginal mucosa pink, moist, normal rugae for age, no lesions. No cystocele or rectocele. cervix without lesions, no discharge.  No bladder/suprapubic fullness, masses or tenderness. No cervical motion tenderness. Anus and perineum within normal limits, no lesions.  Assessment/Plan: Jaime Michael is a 61 y.o. female present for OV for Vitamin D deficiency Estrogen deficiency/ - continue OTC and start supplement weekly for 12 weeks.  -  DG Bone Density; Future Encounter for annual routine gynecological examination/postmenopausal/cervical cancer screen - PAP with HPV cotest completed today.  - Cytology - PAP Breast cancer screening - advised pt to schedule Mam with DEXA.  - MM DIGITAL SCREENING BILATERAL; Future  - F/U yearly for CPE, as long as studies are normal today.    electronically signed by:  Howard Pouch, DO  [place]

## 2016-05-12 NOTE — Patient Instructions (Signed)
It was a pleasure seeing you today. We will call you with PAP results.  Please start prescribed Vit D and continue Over the counter.   They will call you to schedule mammogram and bone density.

## 2016-05-13 LAB — CYTOLOGY - PAP
Diagnosis: NEGATIVE
HPV: NOT DETECTED

## 2016-05-15 ENCOUNTER — Telehealth: Payer: Self-pay | Admitting: Family Medicine

## 2016-05-15 NOTE — Telephone Encounter (Signed)
Please call patient: Her Pap smear was normal.

## 2016-05-16 NOTE — Telephone Encounter (Signed)
Patient notified and verbalized understanding. 

## 2016-05-28 ENCOUNTER — Encounter: Payer: Self-pay | Admitting: Family Medicine

## 2016-06-11 ENCOUNTER — Encounter: Payer: Self-pay | Admitting: Gastroenterology

## 2016-06-11 ENCOUNTER — Ambulatory Visit (AMBULATORY_SURGERY_CENTER): Payer: Self-pay

## 2016-06-11 VITALS — Ht 61.0 in | Wt 152.6 lb

## 2016-06-11 DIAGNOSIS — Z1211 Encounter for screening for malignant neoplasm of colon: Secondary | ICD-10-CM

## 2016-06-11 MED ORDER — NA SULFATE-K SULFATE-MG SULF 17.5-3.13-1.6 GM/177ML PO SOLN: 1.0000 | Freq: Once | ORAL | 0 refills | Status: AC

## 2016-06-11 NOTE — Progress Notes (Signed)
Patient denies allergies to eggs and soy. Patient is not taking diet pills. Patient is not on home 02. Patient denies problems with anesthesia.   Patient declined Emmi.

## 2016-06-25 ENCOUNTER — Ambulatory Visit (AMBULATORY_SURGERY_CENTER): Payer: BC Managed Care – PPO | Admitting: Gastroenterology

## 2016-06-25 ENCOUNTER — Encounter: Payer: Self-pay | Admitting: Gastroenterology

## 2016-06-25 VITALS — BP 121/70 | HR 71 | Temp 99.1°F | Resp 15 | Ht 61.0 in | Wt 152.0 lb

## 2016-06-25 DIAGNOSIS — D122 Benign neoplasm of ascending colon: Secondary | ICD-10-CM

## 2016-06-25 DIAGNOSIS — Z1212 Encounter for screening for malignant neoplasm of rectum: Secondary | ICD-10-CM | POA: Diagnosis not present

## 2016-06-25 DIAGNOSIS — Z1211 Encounter for screening for malignant neoplasm of colon: Secondary | ICD-10-CM

## 2016-06-25 MED ORDER — SODIUM CHLORIDE 0.9 % IV SOLN: 500.0000 mL | INTRAVENOUS | Status: DC

## 2016-06-25 NOTE — Progress Notes (Signed)
Pt's states no medical or surgical changes since previsit or office visit. 

## 2016-06-25 NOTE — Op Note (Signed)
[name] Procedure Date: 06/25/2016 8:41 AM MRN: [id number] Endoscopist: Milus Banister , MD Age: 61 Referring MD:  Date of Birth: 02-28-1956 Gender: Female Account #: [id number] Procedure:                Colonoscopy Indications:              Screening for colorectal malignant neoplasm Medicines:                Monitored Anesthesia Care Procedure:                Pre-Anesthesia Assessment:                           - Prior to the procedure, a History and Physical                            was performed, and patient medications and                            allergies were reviewed. The patient's tolerance of                            previous anesthesia was also reviewed. The risks                            and benefits of the procedure and the sedation                            options and risks were discussed with the patient.                            All questions were answered, and informed consent                            was obtained. Prior Anticoagulants: The patient has                            taken no previous anticoagulant or antiplatelet                            agents. ASA Grade Assessment: II - A patient with                            mild systemic disease. After reviewing the risks                            and benefits, the patient was deemed in                            satisfactory condition to undergo the procedure.                           After obtaining informed consent, the colonoscope  was passed under direct vision. Throughout the                            procedure, the patient's blood pressure, pulse, and                            oxygen saturations were monitored continuously. The                            Colonoscope was introduced through the anus and                            advanced to the the cecum, identified by                            appendiceal orifice and  ileocecal valve. The                            colonoscopy was performed without difficulty. The                            patient tolerated the procedure well. The quality                            of the bowel preparation was good. The ileocecal                            valve, appendiceal orifice, and rectum were                            photographed. Scope In: 3:73:42 AM Scope Out: 9:56:55 AM Scope Withdrawal Time: 0 hours 7 minutes 16 seconds  Total Procedure Duration: 0 hours 10 minutes 1 second  Findings:                 A 2 mm polyp was found in the ascending colon. The                            polyp was sessile. The polyp was removed with a                            cold snare. Resection and retrieval were complete.                           The exam was otherwise without abnormality on                            direct and retroflexion views. Complications:            No immediate complications. Estimated blood loss:                            None. Estimated Blood Loss:     Estimated blood loss: none. Impression:               -  One 2 mm polyp in the ascending colon, removed                            with a cold snare. Resected and retrieved.                           - The examination was otherwise normal on direct                            and retroflexion views. Recommendation:           - Patient has a contact number available for                            emergencies. The signs and symptoms of potential                            delayed complications were discussed with the                            patient. Return to normal activities tomorrow.                            Written discharge instructions were provided to the                            patient.                           - Resume previous diet.                           - Continue present medications.                           You will receive a letter within 2-3 weeks with the                             pathology results and my final recommendations.                           If the polyp(s) is proven to be 'pre-cancerous' on                            pathology, you will need repeat colonoscopy in 5                            years. If the polyp(s) is NOT 'precancerous' on                            pathology then you should repeat colon cancer                            screening in 10 years with colonoscopy without need  for colon cancer screening by any method prior to                            then (including stool testing). Milus Banister, MD 06/25/2016 10:00:54 AM This report has been signed electronically.

## 2016-06-25 NOTE — Patient Instructions (Signed)
YOU HAD AN ENDOSCOPIC PROCEDURE TODAY AT THE [facility]:   Refer to the procedure report that was given to you for any specific questions about what was found during the examination.  If the procedure report does not answer your questions, please call your gastroenterologist to clarify.  If you requested that your care partner not be given the details of your procedure findings, then the procedure report has been included in a sealed envelope for you to review at your convenience later.  YOU SHOULD EXPECT: Some feelings of bloating in the abdomen. Passage of more gas than usual.  Walking can help get rid of the air that was put into your GI tract during the procedure and reduce the bloating. If you had a lower endoscopy (such as a colonoscopy or flexible sigmoidoscopy) you may notice spotting of blood in your stool or on the toilet paper. If you underwent a bowel prep for your procedure, you may not have a normal bowel movement for a few days.  Please Note:  You might notice some irritation and congestion in your nose or some drainage.  This is from the oxygen used during your procedure.  There is no need for concern and it should clear up in a day or so.  SYMPTOMS TO REPORT IMMEDIATELY:   Following lower endoscopy (colonoscopy or flexible sigmoidoscopy):  Excessive amounts of blood in the stool  Significant tenderness or worsening of abdominal pains  Swelling of the abdomen that is new, acute  Fever of 100F or higher   For urgent or emergent issues, a gastroenterologist can be reached at any hour by calling [phone].   DIET:  We do recommend a small meal at first, but then you may proceed to your regular diet.  Drink plenty of fluids but you should avoid alcoholic beverages for 24 hours.  ACTIVITY:  You should plan to take it easy for the rest of today and you should NOT DRIVE or use heavy machinery until tomorrow (because of the sedation medicines used during the test).     FOLLOW UP: Our staff will call the number listed on your records the next business day following your procedure to check on you and address any questions or concerns that you may have regarding the information given to you following your procedure. If we do not reach you, we will leave a message.  However, if you are feeling well and you are not experiencing any problems, there is no need to return our call.  We will assume that you have returned to your regular daily activities without incident.  If any biopsies were taken you will be contacted by phone or by letter within the next 1-3 weeks.  Please call us at [phone] if you have not heard about the biopsies in 3 weeks.    SIGNATURES/CONFIDENTIALITY: You and/or your care partner have signed paperwork which will be entered into your electronic medical record.  These signatures attest to the fact that that the information above on your After Visit Summary has been reviewed and is understood.  Full responsibility of the confidentiality of this discharge information lies with you and/or your care-partner.  Polyp information given. 

## 2016-06-26 ENCOUNTER — Telehealth: Payer: Self-pay

## 2016-06-26 NOTE — Telephone Encounter (Signed)
  Follow up Call-  Call back number 06/25/2016  Post procedure Call Back phone  # [phone]  Permission to leave phone message Yes  Some recent data might be hidden     Patient questions:  Do you have a fever, pain , or abdominal swelling? No. Pain Score  0 *  Have you tolerated food without any problems? Yes.    Have you been able to return to your normal activities? Yes.    Do you have any questions about your discharge instructions: Diet   No. Medications  No. Follow up visit  No.  Do you have questions or concerns about your Care? No.  Actions: * If pain score is 4 or above: No action needed, pain <4.

## 2016-07-01 ENCOUNTER — Encounter: Payer: Self-pay | Admitting: Gastroenterology

## 2016-07-09 ENCOUNTER — Telehealth: Payer: Self-pay | Admitting: Family Medicine

## 2016-07-09 ENCOUNTER — Ambulatory Visit
Admission: RE | Admit: 2016-07-09 | Discharge: 2016-07-09 | Disposition: A | Discharge status: Home / Self Care | Payer: BC Managed Care – PPO | Source: Ambulatory Visit | Attending: Family Medicine | Admitting: Family Medicine

## 2016-07-09 ENCOUNTER — Ambulatory Visit: Payer: BC Managed Care – PPO

## 2016-07-09 ENCOUNTER — Other Ambulatory Visit: Payer: BC Managed Care – PPO

## 2016-07-09 DIAGNOSIS — E559 Vitamin D deficiency, unspecified: Secondary | ICD-10-CM

## 2016-07-09 DIAGNOSIS — E2839 Other primary ovarian failure: Secondary | ICD-10-CM

## 2016-07-09 DIAGNOSIS — Z1239 Encounter for other screening for malignant neoplasm of breast: Secondary | ICD-10-CM

## 2016-07-09 NOTE — Telephone Encounter (Signed)
Please call pt: - her bone density was normal. Continue Vit d supplement.

## 2016-07-09 NOTE — Telephone Encounter (Signed)
Patient notified and verbalized understanding. 

## 2016-08-11 ENCOUNTER — Other Ambulatory Visit (INDEPENDENT_AMBULATORY_CARE_PROVIDER_SITE_OTHER): Payer: BC Managed Care – PPO

## 2016-08-11 DIAGNOSIS — E559 Vitamin D deficiency, unspecified: Secondary | ICD-10-CM

## 2016-08-11 LAB — VITAMIN D 25 HYDROXY (VIT D DEFICIENCY, FRACTURES): VITD: 38.69 ng/mL (ref 30.00–100.00)

## 2016-08-12 ENCOUNTER — Telehealth: Payer: Self-pay | Admitting: Family Medicine

## 2016-08-12 NOTE — Telephone Encounter (Signed)
Left message with results and instructions on patient voice mail per DPR 

## 2016-08-12 NOTE — Telephone Encounter (Signed)
Please call pt: - her vit d is now normal at 38.6. She should continue to the 800-1000u vit d daily OTC to keep level normal.

## 2017-01-12 ENCOUNTER — Telehealth: Payer: Self-pay | Admitting: Family Medicine

## 2017-01-12 NOTE — Telephone Encounter (Signed)
[facility] Day - Client [facility] Patient Name: [facility] DOB: 11-16-1955 Initial Comment Caller states having heart palpitations. Not sure what is causing it. Concerned. Nurse Assessment Nurse: Juleen China, RN, Butch Penny Date/Time Eilene Ghazi Time): 01/12/2017 3:31:52 PM Confirm and document reason for call. If symptomatic, describe symptoms. ---Caller states she is having heart palpitations or irregular heartbeat last week. Improved today. No chest pain. No fever. No swelling in arms or legs. Does the patient have any new or worsening symptoms? ---Yes Will a triage be completed? ---Yes Related visit to physician within the last 2 weeks? ---No Does the PT have any chronic conditions? (i.e. diabetes, asthma, etc.) ---No Is this a behavioral health or substance abuse call? ---No Guidelines Guideline Title Affirmed Question Affirmed Notes Heart Rate and Heartbeat Questions Palpitations Final Disposition User Rosebud, RN, Butch Penny Comments Denies any symptoms when she feels like her heart rate is fast. Encouraged her to take blood pressure during this time to see readings and also it will check her heart rate. Palpitations have improved since last week. Caller Disagree/Comply Comply Caller Understands Yes PreDisposition Call Doctor

## 2017-01-13 NOTE — Telephone Encounter (Signed)
Left message for patient to call if needing appt.

## 2017-01-26 ENCOUNTER — Encounter: Payer: Self-pay | Admitting: Family Medicine

## 2017-01-26 ENCOUNTER — Ambulatory Visit (INDEPENDENT_AMBULATORY_CARE_PROVIDER_SITE_OTHER): Payer: BC Managed Care – PPO | Admitting: Family Medicine

## 2017-01-26 VITALS — BP 113/75 | HR 69 | Temp 98.1°F | Resp 20 | Wt 149.8 lb

## 2017-01-26 DIAGNOSIS — R000 Tachycardia, unspecified: Secondary | ICD-10-CM

## 2017-01-26 DIAGNOSIS — R002 Palpitations: Secondary | ICD-10-CM

## 2017-01-26 NOTE — Progress Notes (Signed)
Jaime, Michael 05-May-1955, 61 y.o., female MRN: [id number] Patient Care Team    Relationship Specialty Notifications Start End  Ma Hillock, DO PCP - General Family Medicine  04/25/16     Chief Complaint  Patient presents with  . Tachycardia    off and on last 3 weeks     Subjective: Pt presents for an OV with complaints of heart racing of 3 week duration.  Associated symptoms include heart racing and fluttering. Initially started 3 weeks and continue throughout the day. Symptoms now seem to be mostly at night after she lays down. She denies chest pain, shortness of breath, dyspnea, orthopnea, or dizziness. She walks a mile daily without chest pain or symptoms.  She is worried because she was reading heart attack signs occur before bed. She did stop using caffeine and changed her diet to see if it would help. She felt decreasing caffeine helped, but it has continued still. She also tried sleep on 2-3 pillows instead of one, at that might have helped some. Pt denies increase in anxiety.  She reports this happened about 20 years ago. She reports she wore an event monitor for a month and they never found out cause. Records not available.    Depression screen [facility] 2/9 01/26/2017 04/25/2016  Decreased Interest 0 0  Down, Depressed, Hopeless 0 0  PHQ - 2 Score 0 0    No Known Allergies Social History  Substance Use Topics  . Smoking status: Never Smoker  . Smokeless tobacco: Never Used  . Alcohol use No   History reviewed. No pertinent past medical history. Past Surgical History:  Procedure Laterality Date  . CESAREAN SECTION     Family History  Problem Relation Age of Onset  . Stomach cancer Father   . Colon cancer Neg Hx    Allergies as of 01/26/2017   No Known Allergies     Medication List       Accurate as of 01/26/17  2:45 PM. Always use your most recent med list.          CALCIUM 500/D 500-400 MG-UNIT Chew Generic drug:  Calcium Carb-Cholecalciferol Chew 1  tablet by mouth daily.   Vitamin D (Ergocalciferol) 50000 units Caps capsule Commonly known as:  DRISDOL Take 1 capsule (50,000 Units total) by mouth every 7 (seven) days.       All past medical history, surgical history, allergies, family history, immunizations andmedications were updated in the EMR today and reviewed under the history and medication portions of their EMR.     ROS: Negative, with the exception of above mentioned in HPI   Objective:  BP 113/75 (BP Location: Left Arm, Patient Position: Sitting, Cuff Size: Normal)   Pulse 69   Temp 98.1 F (36.7 C)   Resp 20   Wt 149 lb 12 oz (67.9 kg)   SpO2 98%   BMI 28.30 kg/m  Body mass index is 28.3 kg/m. Gen: Afebrile. No acute distress. Nontoxic in appearance, well developed, well nourished.  HENT: AT. [place]. MMM, no oral lesions.  Eyes:Pupils Equal Round Reactive to light, Extraocular movements intact,  Conjunctiva without redness, discharge or icterus. Neck/lymp/endocrine: Supple,no lymphadenopathy, no thyromegaly CV: RRR no murmur, no edema Chest: CTAB, no wheeze or crackles. Good air movement, normal resp effort.  Abd: Soft. NTND. BS present. no Masses palpated. Skin: no rashes, purpura or petechiae.  Neuro:  Normal gait. PERLA. EOMi. Alert. Oriented x3. Psych: Normal affect, dress and demeanor. Normal speech.  Normal thought content and judgment. EKG: SR. HR 67, PR 158. QT 410. No St changes. No prior EKG  to compare.   No exam data present No results found. No results found for this or any previous visit (from the past 24 hour(s)).  Assessment/Plan: Jaime Michael is a 61 y.o. female present for OV for  Palpitations - continue to hold from caffeine use. R/o thyroid or electrolyte cause. If all labs normal will refer to cardiology for further evaluation. She is very concerned she will have a heart attack.  - EKG 12-Lead--> SR.  - CBC - Comp Met (CMET) - TSH - Magnesium - F/U PRN   Reviewed expectations re:  course of current medical issues.  Discussed self-management of symptoms.  Outlined signs and symptoms indicating need for more acute intervention.  Patient verbalized understanding and all questions were answered.  Patient received an After-Visit Summary.    No orders of the defined types were placed in this encounter.    Note is dictated utilizing voice recognition software. Although note has been proof read prior to signing, occasional typographical errors still can be missed. If any questions arise, please do not hesitate to call for verification.   electronically signed by:  Howard Pouch, DO  [place]

## 2017-01-26 NOTE — Patient Instructions (Addendum)
Palpitations A palpitation is the feeling that your heart:  Has an uneven (irregular) heartbeat.  Is beating faster than normal.  Is fluttering.  Is skipping a beat.  This is usually not a serious problem. In some cases, you may need more medical tests. Follow these instructions at home:  Avoid: ? Caffeine in coffee, tea, soft drinks, diet pills, and energy drinks. ? Chocolate. ? Alcohol.  Do not use any tobacco products. These include cigarettes, chewing tobacco, and e-cigarettes. If you need help quitting, ask your doctor.  Try to reduce your stress. These things may help: ? Yoga. ? Meditation. ? Physical activity. Swimming, jogging, and walking are good choices. ? A method that helps you use your mind to control things in your body, like heartbeats (biofeedback).  Get plenty of rest and sleep.  Take over-the-counter and prescription medicines only as told by your doctor.  Keep all follow-up visits as told by your doctor. This is important. Contact a doctor if:  Your heartbeat is still fast or uneven after 24 hours.  Your palpitations occur more often. Get help right away if:  You have chest pain.  You feel short of breath.  You have a very bad headache.  You feel dizzy.  You pass out (faint). This information is not intended to replace advice given to you by your health care provider. Make sure you discuss any questions you have with your health care provider. Document Released: 01/15/2008 Document Revised: 09/13/2015 Document Reviewed: 12/21/2014 Elsevier Interactive Patient Education  2018 [employer].   After labs resulted we will call you and discuss plan. Your EKG was normal today.

## 2017-01-27 DIAGNOSIS — R002 Palpitations: Secondary | ICD-10-CM

## 2017-01-27 DIAGNOSIS — R000 Tachycardia, unspecified: Secondary | ICD-10-CM | POA: Insufficient documentation

## 2017-01-27 HISTORY — DX: Palpitations: R00.2

## 2017-01-27 LAB — TSH: TSH: 1.58 m[IU]/L (ref 0.40–4.50)

## 2017-01-27 LAB — COMPREHENSIVE METABOLIC PANEL
AG Ratio: 1.5 (calc) (ref 1.0–2.5)
ALBUMIN MSPROF: 3.9 g/dL (ref 3.6–5.1)
ALKALINE PHOSPHATASE (APISO): 52 U/L (ref 33–130)
ALT: 16 U/L (ref 6–29)
AST: 17 U/L (ref 10–35)
BUN: 12 mg/dL (ref 7–25)
CO2: 29 mmol/L (ref 20–32)
CREATININE: 0.82 mg/dL (ref 0.50–0.99)
Calcium: 9.3 mg/dL (ref 8.6–10.4)
Chloride: 105 mmol/L (ref 98–110)
GLUCOSE: 90 mg/dL (ref 65–99)
Globulin: 2.6 g/dL (calc) (ref 1.9–3.7)
POTASSIUM: 4.4 mmol/L (ref 3.5–5.3)
SODIUM: 142 mmol/L (ref 135–146)
TOTAL PROTEIN: 6.5 g/dL (ref 6.1–8.1)
Total Bilirubin: 0.3 mg/dL (ref 0.2–1.2)

## 2017-01-27 LAB — CBC
HEMATOCRIT: 43.5 % (ref 35.0–45.0)
Hemoglobin: 14.5 g/dL (ref 11.7–15.5)
MCH: 30 pg (ref 27.0–33.0)
MCHC: 33.3 g/dL (ref 32.0–36.0)
MCV: 89.9 fL (ref 80.0–100.0)
MPV: 11.6 fL (ref 7.5–12.5)
Platelets: 361 10*3/uL (ref 140–400)
RBC: 4.84 10*6/uL (ref 3.80–5.10)
RDW: 13 % (ref 11.0–15.0)
WBC: 7 10*3/uL (ref 3.8–10.8)

## 2017-01-27 LAB — MAGNESIUM: Magnesium: 2.1 mg/dL (ref 1.5–2.5)

## 2017-01-27 NOTE — Addendum Note (Signed)
Addended by: Howard Pouch A on: 01/27/2017 07:51 AM   Modules accepted: Orders

## 2017-01-29 ENCOUNTER — Ambulatory Visit (INDEPENDENT_AMBULATORY_CARE_PROVIDER_SITE_OTHER): Payer: BC Managed Care – PPO | Admitting: Cardiology

## 2017-01-29 ENCOUNTER — Encounter: Payer: Self-pay | Admitting: Cardiology

## 2017-01-29 VITALS — BP 130/76 | HR 84 | Ht 61.0 in | Wt 144.0 lb

## 2017-01-29 DIAGNOSIS — R002 Palpitations: Secondary | ICD-10-CM | POA: Diagnosis not present

## 2017-01-29 DIAGNOSIS — R079 Chest pain, unspecified: Secondary | ICD-10-CM | POA: Diagnosis not present

## 2017-01-29 NOTE — Progress Notes (Signed)
Cardiology Office Note:    Date:  01/29/2017   ID:  Jaime Michael, DOB [birth year], MRN [id number]  PCP:  Ma Hillock, DO  Cardiologist:  Jenean Lindau, MD   Referring MD: Ma Hillock, DO    ASSESSMENT:    1. Palpitations   2. Chest pain, unspecified type    PLAN:    In order of problems listed above:  1. I reassured the patient about my findings. Her palpitations are of concern. Thyroid done at her primary care physician's office was unremarkable. I will do a one-month Holter monitor. I told her to leave her normal lifestyle and not to refrain any particular food that she might think is causing these problems so we can have a good idea "going on with her heart. 2. In view of her chest pain I will do exercise stress echo. This is atypical for coronary etiology however it will help reassured her and also he was an idea for coronary status and valvular and cardiac anatomy. 3. Patient will be seen in follow-up appointment in 3 months or earlier if the patient has any concerns.    Medication Adjustments/Labs and Tests Ordered: Current medicines are reviewed at length with the patient today.  Concerns regarding medicines are outlined above.  Orders Placed This Encounter  Procedures  . Cardiac event monitor  . ECHOCARDIOGRAM STRESS TEST   No orders of the defined types were placed in this encounter.    History of Present Illness:    Jaime Michael is a 61 y.o. female who is being seen today for the evaluation of palpitations and chest pain at the request of Kuneff, Renee A, DO. Patient is a pleasant 61 year old female. She has no significant past medical history and is a healthy adult female. She does not exercise on a regular basis she mentions to me that she has chest tightness at times not related to exertion. She can walk very active lady and in distress or any such symptoms she does not have any chest pain. What concerns her know is that she feels that her heart is  beating fast at times. She has quit drinking coffee and is trying to follow diet so that she does not have palpitations. This is not related to much success. At the time of my evaluation she is alert awake oriented and in no distress.  History reviewed. No pertinent past medical history.  Past Surgical History:  Procedure Laterality Date  . CESAREAN SECTION      Current Medications: Current Meds  Medication Sig  . Calcium Carb-Cholecalciferol (CALCIUM 500/D) 500-400 MG-UNIT CHEW Chew 1 tablet by mouth daily.     Allergies:   Patient has no known allergies.   Social History   Social History  . Marital status: Married    Spouse name: Ahamd  . Number of children: 1  . Years of education: PhD   Occupational History  . Teacher    Social History Main Topics  . Smoking status: Never Smoker  . Smokeless tobacco: Never Used  . Alcohol use No  . Drug use: No  . Sexual activity: No     Comment: Married   Other Topics Concern  . None   Social History Narrative   Married to [place]. One child   PHD/professor   Drinks caffeine, uses herbal remedies.   Wears her seatbelt. Smoke detectors in the home.   Feels safe in her relationships.     Family History: The patient's family history  includes Stomach cancer in her father. There is no history of Colon cancer.  ROS:   Please see the history of present illness.    All other systems reviewed and are negative.  EKGs/Labs/Other Studies Reviewed:    The following studies were reviewed today: I reviewed records from primary care physician's office and discuss the results with the patient at length.   Recent Labs: 01/26/2017: ALT 16; BUN 12; Creat 0.82; Hemoglobin 14.5; Magnesium 2.1; Platelets 361; Potassium 4.4; Sodium 142; TSH 1.58  Recent Lipid Panel    Component Value Date/Time   CHOL 193 04/25/2016 1000   TRIG 95.0 04/25/2016 1000   HDL 73.70 04/25/2016 1000   CHOLHDL 3 04/25/2016 1000   VLDL 19.0 04/25/2016 1000    LDLCALC 100 (H) 04/25/2016 1000    Physical Exam:    VS:  BP 130/76   Pulse 84   Ht 5\' 1"  (1.549 m)   Wt 144 lb (65.3 kg)   SpO2 96%   BMI 27.21 kg/m     Wt Readings from Last 3 Encounters:  01/29/17 144 lb (65.3 kg)  01/26/17 149 lb 12 oz (67.9 kg)  06/25/16 152 lb (68.9 kg)     GEN: Patient is in no acute distress HEENT: Normal NECK: No JVD; No carotid bruits LYMPHATICS: No lymphadenopathy CARDIAC: S1 S2 regular, 2/6 systolic murmur at the apex. RESPIRATORY:  Clear to auscultation without rales, wheezing or rhonchi  ABDOMEN: Soft, non-tender, non-distended MUSCULOSKELETAL:  No edema; No deformity  SKIN: Warm and dry NEUROLOGIC:  Alert and oriented x 3 PSYCHIATRIC:  Normal affect    Signed, Jenean Lindau, MD  01/29/2017 11:55 AM    [facility]

## 2017-01-29 NOTE — Patient Instructions (Signed)
Medication Instructions:   Your physician recommends that you continue on your current medications as directed. Please refer to the Current Medication list given to you today.   Labwork:  NONE  Testing/Procedures:  Your physician has requested that you have a stress echocardiogram. For further information please visit [email or web address]. Please follow instruction sheet as given.  Your physician has recommended that you wear an event monitor. Event monitors are medical devices that record the heart's electrical activity. Doctors most often [place] these monitors to diagnose arrhythmias. Arrhythmias are problems with the speed or rhythm of the heartbeat. The monitor is a small, portable device. You can wear one while you do your normal daily activities. This is usually used to diagnose what is causing palpitations/syncope (passing out).    Follow-Up:  Your physician recommends that you schedule a follow-up appointment in: 3 months with Dr. Geraldo Pitter.    Any Other Special Instructions Will Be Listed Below (If Applicable).     If you need a refill on your cardiac medications before your next appointment, please call your pharmacy.

## 2017-02-05 ENCOUNTER — Ambulatory Visit: Payer: BC Managed Care – PPO

## 2017-02-05 DIAGNOSIS — R079 Chest pain, unspecified: Secondary | ICD-10-CM

## 2017-02-05 DIAGNOSIS — R002 Palpitations: Secondary | ICD-10-CM

## 2017-03-05 ENCOUNTER — Ambulatory Visit (HOSPITAL_BASED_OUTPATIENT_CLINIC_OR_DEPARTMENT_OTHER)
Admission: RE | Admit: 2017-03-05 | Discharge: 2017-03-05 | Disposition: A | Discharge status: Home / Self Care | Payer: BC Managed Care – PPO | Source: Ambulatory Visit | Attending: Cardiology | Admitting: Cardiology

## 2017-03-05 DIAGNOSIS — R079 Chest pain, unspecified: Secondary | ICD-10-CM | POA: Insufficient documentation

## 2017-03-05 DIAGNOSIS — R002 Palpitations: Secondary | ICD-10-CM

## 2017-03-05 NOTE — Progress Notes (Signed)
  Echocardiogram Echocardiogram Stress Test has been performed.  Jaime Michael 03/05/2017, 11:26 AM

## 2017-03-10 ENCOUNTER — Other Ambulatory Visit: Payer: Self-pay

## 2017-03-10 ENCOUNTER — Telehealth: Payer: Self-pay

## 2017-03-10 MED ORDER — METOPROLOL TARTRATE 25 MG PO TABS: 12.5000 mg | ORAL_TABLET | Freq: Two times a day (BID) | ORAL | 0 refills | Status: DC

## 2017-03-10 NOTE — Telephone Encounter (Signed)
Informed patient of her monitor results. Educated patient regarding the abnormalities. Patient stated that her palpitations are worse in the evening and early morning. Upon discussion with Dr. Geraldo Pitter metoprolol 12.5 bud was added. The patient was agreeable to the new medication.

## 2017-03-11 ENCOUNTER — Other Ambulatory Visit: Payer: Self-pay

## 2017-03-11 ENCOUNTER — Telehealth: Payer: Self-pay

## 2017-03-11 MED ORDER — NEBIVOLOL HCL 5 MG PO TABS: 5.0000 mg | ORAL_TABLET | Freq: Every day | ORAL | 0 refills | Status: DC | PRN

## 2017-03-11 NOTE — Telephone Encounter (Signed)
Patient called today complaining of the metoprolol causing her feel fatigued. Per Dr. Geraldo Pitter the patient can take bystolic PRN daily. The patient was agreeable to this.

## 2017-04-06 ENCOUNTER — Telehealth: Payer: Self-pay | Admitting: Cardiology

## 2017-04-06 ENCOUNTER — Other Ambulatory Visit: Payer: Self-pay

## 2017-04-06 MED ORDER — METOPROLOL TARTRATE 25 MG PO TABS: 12.5000 mg | ORAL_TABLET | Freq: Two times a day (BID) | ORAL | 0 refills | Status: DC

## 2017-04-06 NOTE — Telephone Encounter (Signed)
Patient stated that she never filled the bystolic. She continued the 12.5 mg twice a day of metoprolol and her symptoms improved. She requested to stay on this medication; refill was sent.

## 2017-04-06 NOTE — Telephone Encounter (Signed)
Jaime Michael is questioning whether her Metoprolol needs to be refilled. She has taken 30 days of it but unsure of refills.

## 2017-04-06 NOTE — Telephone Encounter (Signed)
Refill sent of bystolic

## 2017-06-05 ENCOUNTER — Encounter: Payer: Self-pay | Admitting: Cardiology

## 2017-06-05 ENCOUNTER — Ambulatory Visit: Payer: BC Managed Care – PPO | Admitting: Cardiology

## 2017-06-05 VITALS — BP 122/76 | HR 63 | Ht 61.0 in | Wt 146.8 lb

## 2017-06-05 DIAGNOSIS — R002 Palpitations: Secondary | ICD-10-CM

## 2017-06-05 DIAGNOSIS — R079 Chest pain, unspecified: Secondary | ICD-10-CM

## 2017-06-05 NOTE — Progress Notes (Signed)
Cardiology Office Note:    Date:  06/05/2017   ID:  Jaime Michael, DOB [birth year], MRN [id number]  PCP:  Ma Hillock, DO  Cardiologist:  Jenean Lindau, MD   Referring MD: Ma Hillock, DO    ASSESSMENT:    1. Chest pain, unspecified type   2. Palpitations    PLAN:    In order of problems listed above:  1. I discussed my findings with the patient at extensive length.  Her evaluation today is fine.  I asked her to continue her exercise program.  Her chest pain is atypical for coronary etiology however I gave her an option of coronary CT angiography and she is not keen on it at this time.  I told her to continue her good exercise program and if she has any significant concerns to rethink about what I offered her.  She will be seen in follow-up appointment in 6 months or earlier if she has any concerns.  Primary prevention stressed with the patient.  She knows to go to the nearest emergency room for any significant concerning symptoms.   Medication Adjustments/Labs and Tests Ordered: Current medicines are reviewed at length with the patient today.  Concerns regarding medicines are outlined above.  No orders of the defined types were placed in this encounter.  No orders of the defined types were placed in this encounter.    Chief Complaint  Patient presents with  . Follow-up  . Palpitations     History of Present Illness:    Jaime Michael is a 62 y.o. female.  The patient was evaluated by me for palpitations.  She has PACs and atrial runs.  She mentions to me that she discontinued her coffee a month and is feeling fine.  She has resumed coffee and also has become lax with exercise.  She occasionally gets very brief palpitations.  No chest pain orthopnea or PND.  She tells me that she can walk for an hour without any problems.  She has some chest pain when she lifts heavy stuff and she is moving her house at this time.  History reviewed. No pertinent past medical  history.  Past Surgical History:  Procedure Laterality Date  . CESAREAN SECTION      Current Medications: Current Meds  Medication Sig  . Calcium Carb-Cholecalciferol (CALCIUM 500/D) 500-400 MG-UNIT CHEW Chew 1 tablet by mouth 2 (two) times daily.   . metoprolol tartrate (LOPRESSOR) 25 MG tablet Take 0.5 tablets (12.5 mg total) by mouth 2 (two) times daily. Take 1/2 tablet in the morning and evening     Allergies:   Patient has no known allergies.   Social History   Socioeconomic History  . Marital status: Married    Spouse name: Ahamd  . Number of children: 1  . Years of education: PhD  . Highest education level: None  Social Needs  . Financial resource strain: None  . Food insecurity - worry: None  . Food insecurity - inability: None  . Transportation needs - medical: None  . Transportation needs - non-medical: None  Occupational History  . Occupation: Pharmacist, hospital  Tobacco Use  . Smoking status: Never Smoker  . Smokeless tobacco: Never Used  Substance and Sexual Activity  . Alcohol use: No  . Drug use: No  . Sexual activity: No    Partners: Male    Birth control/protection: None    Comment: Married  Other Topics Concern  . None  Social History Narrative  Married to [place]. One child   PHD/professor   Drinks caffeine, uses herbal remedies.   Wears her seatbelt. Smoke detectors in the home.   Feels safe in her relationships.     Family History: The patient's family history includes Stomach cancer in her father. There is no history of Colon cancer.  ROS:   Please see the history of present illness.    All other systems reviewed and are negative.  EKGs/Labs/Other Studies Reviewed:    The following studies were reviewed today: I discussed my findings including stress echo and Holter monitoring with the patient at extensive length and she vocalized understanding.   Recent Labs: 01/26/2017: ALT 16; BUN 12; Creat 0.82; Hemoglobin 14.5; Magnesium 2.1; Platelets  361; Potassium 4.4; Sodium 142; TSH 1.58  Recent Lipid Panel    Component Value Date/Time   CHOL 193 04/25/2016 1000   TRIG 95.0 04/25/2016 1000   HDL 73.70 04/25/2016 1000   CHOLHDL 3 04/25/2016 1000   VLDL 19.0 04/25/2016 1000   LDLCALC 100 (H) 04/25/2016 1000    Physical Exam:    VS:  BP 122/76 (BP Location: Left Arm, Patient Position: Sitting, Cuff Size: Normal)   Pulse 63   Ht 5\' 1"  (1.549 m)   Wt 146 lb 12.8 oz (66.6 kg)   SpO2 98%   BMI 27.74 kg/m     Wt Readings from Last 3 Encounters:  06/05/17 146 lb 12.8 oz (66.6 kg)  01/29/17 144 lb (65.3 kg)  01/26/17 149 lb 12 oz (67.9 kg)     GEN: Patient is in no acute distress HEENT: Normal NECK: No JVD; No carotid bruits LYMPHATICS: No lymphadenopathy CARDIAC: Hear sounds regular, 2/6 systolic murmur at the apex. RESPIRATORY:  Clear to auscultation without rales, wheezing or rhonchi  ABDOMEN: Soft, non-tender, non-distended MUSCULOSKELETAL:  No edema; No deformity  SKIN: Warm and dry NEUROLOGIC:  Alert and oriented x 3 PSYCHIATRIC:  Normal affect   Signed, Jenean Lindau, MD  06/05/2017 10:22 AM    [facility]

## 2017-06-05 NOTE — Patient Instructions (Signed)

## 2017-09-26 ENCOUNTER — Other Ambulatory Visit: Payer: Self-pay | Admitting: Cardiology

## 2017-09-28 ENCOUNTER — Telehealth: Payer: Self-pay | Admitting: Cardiology

## 2017-09-28 ENCOUNTER — Other Ambulatory Visit: Payer: Self-pay

## 2017-09-28 MED ORDER — METOPROLOL TARTRATE 25 MG PO TABS: ORAL_TABLET | ORAL | 0 refills | Status: DC

## 2017-09-28 NOTE — Telephone Encounter (Signed)
Revankar patient 

## 2017-09-28 NOTE — Telephone Encounter (Signed)
Please call in a refill for metoprolol 25mg  to [facility] in [place]

## 2017-09-28 NOTE — Telephone Encounter (Signed)
Refill had been sent task complete

## 2018-03-05 ENCOUNTER — Ambulatory Visit: Payer: BC Managed Care – PPO | Admitting: Cardiology

## 2018-03-05 ENCOUNTER — Encounter: Payer: Self-pay | Admitting: Cardiology

## 2018-03-05 VITALS — BP 120/78 | HR 65 | Ht 61.0 in | Wt 150.0 lb

## 2018-03-05 DIAGNOSIS — R079 Chest pain, unspecified: Secondary | ICD-10-CM

## 2018-03-05 NOTE — Progress Notes (Signed)
Cardiology Office Note:    Date:  03/05/2018   ID:  Jaime Michael, DOB Oct 07, 1955, MRN [id number]  PCP:  Ma Hillock, DO  Cardiologist:  Jenean Lindau, MD   Referring MD: Ma Hillock, DO    ASSESSMENT:    1. Chest pain, unspecified type    PLAN:    In order of problems listed above:  1. Secondary prevention stressed with the patient.  Importance of compliance with diet and medication stressed and she vocalized understanding.  Her blood pressure is stable.  Diet was discussed for dyslipidemia.  She tells me that she will get it checked by her primary care physician.  I also told her that since she is very worried about primary prevention that she could get a calcium score testing done to assess her risks and risk stratification.  She wants to think about it and let me know. 2. In view of her syncope like episodes which are very vague and do not seem to have a cardiac etiology based on her history I told her to get in touch with her primary care physician and a possible neurology referral. 3. Patient will be seen in follow-up appointment in 6 months or earlier if the patient has any concerns    Medication Adjustments/Labs and Tests Ordered: Current medicines are reviewed at length with the patient today.  Concerns regarding medicines are outlined above.  Orders Placed This Encounter  Procedures  . EKG 12-Lead   No orders of the defined types were placed in this encounter.    No chief complaint on file.    History of Present Illness:    Jaime Michael is a 62 y.o. female patient was seen for chest discomfort and palpitations and these have subsequently resolved.  He denies any problems at this time.  No chest pain orthopnea or PND.  She takes care of activities of daily living.  At the time of my evaluation, the patient is alert awake oriented and in no distress.  She exercises on a regular basis and denies any chest pain.  She tells me that she has flown  internationally twice and during those episodes she had episodes of syncope while sitting in her seat with no activity.  She also tells me that this happened in the postprandial state.  History reviewed. No pertinent past medical history.  Past Surgical History:  Procedure Laterality Date  . CESAREAN SECTION      Current Medications: Current Meds  Medication Sig  . Calcium Carb-Cholecalciferol (CALCIUM 500/D) 500-400 MG-UNIT CHEW Chew 1 tablet by mouth 2 (two) times daily.   . Cholecalciferol (VITAMIN D3) 20 MCG (800 UNIT) TABS Take 1 tablet by mouth daily.  . metoprolol tartrate (LOPRESSOR) 25 MG tablet TAKE 0.5 TABLET BY MOUTH TWICE DAILY. TAKE 1/2 TABLET IN THE MORNING AND EVENING     Allergies:   Patient has no known allergies.   Social History   Socioeconomic History  . Marital status: Married    Spouse name: Ahamd  . Number of children: 1  . Years of education: PhD  . Highest education level: Not on file  Occupational History  . Occupation: Pharmacist, hospital  Social Needs  . Financial resource strain: Not on file  . Food insecurity:    Worry: Not on file    Inability: Not on file  . Transportation needs:    Medical: Not on file    Non-medical: Not on file  Tobacco Use  . Smoking status: Never Smoker  .  Smokeless tobacco: Never Used  Substance and Sexual Activity  . Alcohol use: No  . Drug use: No  . Sexual activity: Never    Partners: Male    Birth control/protection: None    Comment: Married  Lifestyle  . Physical activity:    Days per week: Not on file    Minutes per session: Not on file  . Stress: Not on file  Relationships  . Social connections:    Talks on phone: Not on file    Gets together: Not on file    Attends religious service: Not on file    Active member of club or organization: Not on file    Attends meetings of clubs or organizations: Not on file    Relationship status: Not on file  Other Topics Concern  . Not on file  Social History Narrative     Married to [place]. One child   PHD/professor   Drinks caffeine, uses herbal remedies.   Wears her seatbelt. Smoke detectors in the home.   Feels safe in her relationships.     Family History: The patient's family history includes Stomach cancer in her father. There is no history of Colon cancer.  ROS:   Please see the history of present illness.    All other systems reviewed and are negative.  EKGs/Labs/Other Studies Reviewed:    The following studies were reviewed today: I discussed my findings of the Holter monitor and stress test with the patient at length.   Recent Labs: No results found for requested labs within last 8760 hours.  Recent Lipid Panel    Component Value Date/Time   CHOL 193 04/25/2016 1000   TRIG 95.0 04/25/2016 1000   HDL 73.70 04/25/2016 1000   CHOLHDL 3 04/25/2016 1000   VLDL 19.0 04/25/2016 1000   LDLCALC 100 (H) 04/25/2016 1000    Physical Exam:    VS:  BP 120/78 (BP Location: Right Arm, Patient Position: Sitting, Cuff Size: Normal)   Pulse 65   Ht 5\' 1"  (1.549 m)   Wt 150 lb (68 kg)   SpO2 96%   BMI 28.34 kg/m     Wt Readings from Last 3 Encounters:  03/05/18 150 lb (68 kg)  06/05/17 146 lb 12.8 oz (66.6 kg)  01/29/17 144 lb (65.3 kg)     GEN: Patient is in no acute distress HEENT: Normal NECK: No JVD; No carotid bruits LYMPHATICS: No lymphadenopathy CARDIAC: Hear sounds regular, 2/6 systolic murmur at the apex. RESPIRATORY:  Clear to auscultation without rales, wheezing or rhonchi  ABDOMEN: Soft, non-tender, non-distended MUSCULOSKELETAL:  No edema; No deformity  SKIN: Warm and dry NEUROLOGIC:  Alert and oriented x 3 PSYCHIATRIC:  Normal affect   Signed, Jenean Lindau, MD  03/05/2018 10:32 AM    [place]

## 2018-03-05 NOTE — Patient Instructions (Signed)
Medication Instructions:  Your physician recommends that you continue on your current medications as directed. Please refer to the Current Medication list given to you today.  If you need a refill on your cardiac medications before your next appointment, please call your pharmacy.   Lab work: None  If you have labs (blood work) drawn today and your tests are completely normal, you will receive your results only by: Marland Kitchen MyChart Message (if you have MyChart) OR . A paper copy in the mail If you have any lab test that is abnormal or we need to change your treatment, we will call you to review the results.  Testing/Procedures: None  Follow-Up: At [facility], you and your health needs are our priority.  As part of our continuing mission to provide you with exceptional heart care, we have created designated Provider Care Teams.  These Care Teams include your primary Cardiologist (physician) and Advanced Practice Providers (APPs -  Physician Assistants and Nurse Practitioners) who all work together to provide you with the care you need, when you need it.  You will need a follow up appointment in 6 months.  Please call our office 2 months in advance to schedule this appointment.  You may see another member of our [employer] Provider Team in [place]: Jenne Campus, MD . Shirlee More, MD  Any Other Special Instructions Will Be Listed Below (If Applicable).

## 2018-05-07 ENCOUNTER — Encounter: Payer: BC Managed Care – PPO | Admitting: Family Medicine

## 2018-05-14 ENCOUNTER — Telehealth: Payer: Self-pay

## 2018-05-14 ENCOUNTER — Encounter: Payer: Self-pay | Admitting: Family Medicine

## 2018-05-14 ENCOUNTER — Ambulatory Visit (INDEPENDENT_AMBULATORY_CARE_PROVIDER_SITE_OTHER): Payer: BC Managed Care – PPO | Admitting: Family Medicine

## 2018-05-14 VITALS — BP 115/72 | HR 60 | Temp 98.0°F | Resp 16 | Ht 60.5 in | Wt 150.4 lb

## 2018-05-14 DIAGNOSIS — R002 Palpitations: Secondary | ICD-10-CM

## 2018-05-14 DIAGNOSIS — E559 Vitamin D deficiency, unspecified: Secondary | ICD-10-CM | POA: Diagnosis not present

## 2018-05-14 DIAGNOSIS — E663 Overweight: Secondary | ICD-10-CM

## 2018-05-14 DIAGNOSIS — Z1239 Encounter for other screening for malignant neoplasm of breast: Secondary | ICD-10-CM

## 2018-05-14 DIAGNOSIS — Z131 Encounter for screening for diabetes mellitus: Secondary | ICD-10-CM

## 2018-05-14 DIAGNOSIS — Z0000 Encounter for general adult medical examination without abnormal findings: Secondary | ICD-10-CM | POA: Diagnosis not present

## 2018-05-14 LAB — COMPREHENSIVE METABOLIC PANEL
ALBUMIN: 4.2 g/dL (ref 3.5–5.2)
ALT: 14 U/L (ref 0–35)
AST: 17 U/L (ref 0–37)
Alkaline Phosphatase: 48 U/L (ref 39–117)
BILIRUBIN TOTAL: 0.7 mg/dL (ref 0.2–1.2)
BUN: 12 mg/dL (ref 6–23)
CO2: 31 mEq/L (ref 19–32)
CREATININE: 0.95 mg/dL (ref 0.40–1.20)
Calcium: 10 mg/dL (ref 8.4–10.5)
Chloride: 107 mEq/L (ref 96–112)
GFR: 59.55 mL/min — ABNORMAL LOW (ref >60.00)
Glucose, Bld: 95 mg/dL (ref 70–99)
Potassium: 4.7 mEq/L (ref 3.5–5.1)
SODIUM: 144 meq/L (ref 135–145)
TOTAL PROTEIN: 7.1 g/dL (ref 6.0–8.3)

## 2018-05-14 LAB — CBC
HCT: 47.5 % — ABNORMAL HIGH (ref 36.0–46.0)
Hemoglobin: 15.6 g/dL — ABNORMAL HIGH (ref 12.0–15.0)
MCHC: 32.8 g/dL (ref 30.0–36.0)
MCV: 92.3 fl (ref 78.0–100.0)
PLATELETS: 362 10*3/uL (ref 150.0–400.0)
RBC: 5.15 Mil/uL — AB (ref 3.87–5.11)
RDW: 14.4 % (ref 11.5–15.5)
WBC: 6.2 10*3/uL (ref 4.0–10.5)

## 2018-05-14 LAB — LIPID PANEL
CHOL/HDL RATIO: 3
Cholesterol: 190 mg/dL (ref 0–200)
HDL: 70.5 mg/dL (ref >39.00)
LDL Cholesterol: 97 mg/dL (ref 0–99)
NONHDL: 119.08
Triglycerides: 108 mg/dL (ref 0.0–149.0)
VLDL: 21.6 mg/dL (ref 0.0–40.0)

## 2018-05-14 LAB — TSH: TSH: 1.3 u[IU]/mL (ref 0.35–4.50)

## 2018-05-14 LAB — HEMOGLOBIN A1C: HEMOGLOBIN A1C: 5.7 % (ref 4.6–6.5)

## 2018-05-14 NOTE — Telephone Encounter (Signed)
Pt was in the office today. When asked if she received the Shingrix vaccine, she claimed to have received it at [facility] on [address] in [place]. I called an spoke w the on duty pharmacist and she looked through her records and did not see a recording of this pt receiving the Shringrix vaccine or any other vaccine there.

## 2018-05-14 NOTE — Progress Notes (Signed)
Patient ID: Jaime Michael, female  DOB: 1956-01-16, 63 y.o.   MRN: [id number] Patient Care Team    Relationship Specialty Notifications Start End  Ma Hillock, DO PCP - General Family Medicine  04/25/16   Revankar, Reita Cliche, MD Consulting Physician Cardiology  05/14/18     Chief Complaint  Patient presents with  . Annual Exam    Fasting.     Subjective:  Jaime Michael is a 63 y.o.  Female  present for CPE . All past medical history, surgical history, allergies, family history, immunizations, medications and social history were updated in the electronic medical record today. All recent labs, ED visits and hospitalizations within the last year were reviewed.  Health maintenance:  Colonoscopy: No family history, completed 06/25/2016. Ardis Hughs, polyp, 5 year.  Mammogram: 07/09/2016; birads 1. No family history.  Cervical cancer screening: 05/12/2016; Normal and neg co test; Kuneff Immunizations: tdap UTD 2018. Declined flu shot.  zostavax stats she did get one shot at [facility] last year, did does not recall a second.  Infectious disease screening: HIV and Hep C completed today. DEXA: never Assistive device: None Oxygen [id number] Patient has a Dental home. Hospitalizations/ED visits: reviewed  Depression screen [facility] 2/9 05/14/2018 01/26/2017 04/25/2016  Decreased Interest 0 0 0  Down, Depressed, Hopeless 0 0 0  PHQ - 2 Score 0 0 0   No flowsheet data found.   Immunization History  Administered Date(s) Administered  . Tdap 04/25/2016    History reviewed. No pertinent past medical history. No Known Allergies Past Surgical History:  Procedure Laterality Date  . CESAREAN SECTION     Family History  Problem Relation Age of Onset  . Stomach cancer Father   . Colon cancer Neg Hx    Social History   Socioeconomic History  . Marital status: Married    Spouse name: Ahamd  . Number of children: 1  . Years of education: PhD  . Highest education level: Not on file    Occupational History  . Occupation: Pharmacist, hospital  Social Needs  . Financial resource strain: Not on file  . Food insecurity:    Worry: Not on file    Inability: Not on file  . Transportation needs:    Medical: Not on file    Non-medical: Not on file  Tobacco Use  . Smoking status: Never Smoker  . Smokeless tobacco: Never Used  Substance and Sexual Activity  . Alcohol use: No  . Drug use: No  . Sexual activity: Never    Partners: Male    Birth control/protection: None    Comment: Married  Lifestyle  . Physical activity:    Days per week: Not on file    Minutes per session: Not on file  . Stress: Not on file  Relationships  . Social connections:    Talks on phone: Not on file    Gets together: Not on file    Attends religious service: Not on file    Active member of club or organization: Not on file    Attends meetings of clubs or organizations: Not on file    Relationship status: Not on file  . Intimate partner violence:    Fear of current or ex partner: Not on file    Emotionally abused: Not on file    Physically abused: Not on file    Forced sexual activity: Not on file  Other Topics Concern  . Not on file  Social History Narrative   Married  to [place]. One child   PHD/professor   Drinks caffeine, uses herbal remedies.   Wears her seatbelt. Smoke detectors in the home.   Feels safe in her relationships.   Allergies as of 05/14/2018   No Known Allergies     Medication List       Accurate as of May 14, 2018  9:52 AM. Always use your most recent med list.        CALCIUM 500/D 500-400 MG-UNIT Chew Generic drug:  Calcium Carb-Cholecalciferol Chew 1 tablet by mouth 2 (two) times daily.   metoprolol tartrate 25 MG tablet Commonly known as:  LOPRESSOR TAKE 0.5 TABLET BY MOUTH TWICE DAILY. TAKE 1/2 TABLET IN THE MORNING AND EVENING       All past medical history, surgical history, allergies, family history, immunizations andmedications were updated in the  EMR today and reviewed under the history and medication portions of their EMR.     No results found for this or any previous visit (from the past 2160 hour(s)).  No results found.   ROS: 14 pt review of systems performed and negative (unless mentioned in an HPI)  Objective: BP 115/72 (BP Location: Left Arm, Patient Position: Sitting, Cuff Size: Normal)   Pulse 60   Temp 98 F (36.7 C) (Oral)   Resp 16   Ht 5' 0.5" (1.537 m)   Wt 150 lb 6 oz (68.2 kg)   SpO2 98%   BMI 28.88 kg/m  Gen: Afebrile. No acute distress. Nontoxic in appearance, well-developed, well-nourished,  Overweight female.  HENT: AT. [place]. Bilateral TM visualized and normal in appearance, normal external auditory canal. MMM, no oral lesions, adequate dentition. Bilateral nares within normal limits. Throat without erythema, ulcerations or exudates. no Cough on exam, no hoarseness on exam. Eyes:Pupils Equal Round Reactive to light, Extraocular movements intact,  Conjunctiva without redness, discharge or icterus. Neck/lymp/endocrine: Supple,no lymphadenopathy, no thyromegaly CV: RRR no murmur, no edema, +2/4 P posterior tibialis pulses. no carotid bruits. No JVD. Chest: CTAB, no wheeze, rhonchi or crackles. normal Respiratory effort. good Air movement. Abd: Soft. NTND. BS present. no Masses palpated. No hepatosplenomegaly. No rebound tenderness or guarding. Skin: no rashes, purpura or petechiae. Warm and well-perfused. Skin intact. Neuro/Msk:  Normal gait. PERLA. EOMi. Alert. Oriented x3.  Cranial nerves II through XII intact. Muscle strength 5/5 upper/lower extremity. DTRs equal bilaterally. Psych: Normal affect, dress and demeanor. Normal speech. Normal thought content and judgment.   No exam data present  Assessment/plan: Jaime Michael is a 63 y.o. female present for CPE. Overweight (BMI 25.0-29.9) Diet and exercise - TSH - Lipid panel Palpitations Prescribed metoprolol by cardiology - CBC - Comp Met (CMET) -  TSH Diabetes mellitus screening - HgB A1c Encounter for preventive health examination Patient was encouraged to exercise greater than 150 minutes a week. Patient was encouraged to choose a diet filled with fresh fruits and vegetables, and lean meats. AVS provided to patient today for education/recommendation on gender specific health and safety maintenance. Colonoscopy: No family history, completed 06/25/2016. Ardis Hughs, polyp, 5 year.  Mammogram: 07/09/2016; birads 1. No family history. Ordered today.   Cervical cancer screening: 05/12/2016; Normal and neg co test; Kuneff Immunizations: tdap UTD 2018. Declined flu shot.  zostavax stats she did get one shot at [facility] last year, did does not recall a second.  Infectious disease screening: HIV and Hep C completed today. DEXA: 2018- normal  Return in about 1 year (around 05/15/2019) for CPE.  Electronically signed by: Howard Pouch, DO  [place]

## 2018-05-14 NOTE — Patient Instructions (Signed)
Wonderful to see you today.    Health Maintenance After Age 63 After age 31, you are at a higher risk for certain long-term diseases and infections as well as injuries from falls. Falls are a major cause of broken bones and head injuries in people who are older than age 52. Getting regular preventive care can help to keep you healthy and well. Preventive care includes getting regular testing and making lifestyle changes as recommended by your health care provider. Talk with your health care provider about:  Which screenings and tests you should have. A screening is a test that checks for a disease when you have no symptoms.  A diet and exercise plan that is right for you. What should I know about screenings and tests to prevent falls? Screening and testing are the best ways to find a health problem early. Early diagnosis and treatment give you the best chance of managing medical conditions that are common after age 65. Certain conditions and lifestyle choices may make you more likely to have a fall. Your health care provider may recommend:  Regular vision checks. Poor vision and conditions such as cataracts can make you more likely to have a fall. If you wear glasses, make sure to get your prescription updated if your vision changes.  Medicine review. Work with your health care provider to regularly review all of the medicines you are taking, including over-the-counter medicines. Ask your health care provider about any side effects that may make you more likely to have a fall. Tell your health care provider if any medicines that you take make you feel dizzy or sleepy.  Osteoporosis screening. Osteoporosis is a condition that causes the bones to get weaker. This can make the bones weak and cause them to break more easily.  Blood pressure screening. Blood pressure changes and medicines to control blood pressure can make you feel dizzy.  Strength and balance checks. Your health care provider may  recommend certain tests to check your strength and balance while standing, walking, or changing positions.  Foot health exam. Foot pain and numbness, as well as not wearing proper footwear, can make you more likely to have a fall.  Depression screening. You may be more likely to have a fall if you have a fear of falling, feel emotionally low, or feel unable to do activities that you used to do.  Alcohol use screening. Using too much alcohol can affect your balance and may make you more likely to have a fall. What actions can I take to lower my risk of falls? General instructions  Talk with your health care provider about your risks for falling. Tell your health care provider if: ? You fall. Be sure to tell your health care provider about all falls, even ones that seem minor. ? You feel dizzy, sleepy, or off-balance.  Take over-the-counter and prescription medicines only as told by your health care provider. These include any supplements.  Eat a healthy diet and maintain a healthy weight. A healthy diet includes low-fat dairy products, low-fat (lean) meats, and fiber from whole grains, beans, and lots of fruits and vegetables. Home safety  Remove any tripping hazards, such as rugs, cords, and clutter.  Install safety equipment such as grab bars in bathrooms and safety rails on stairs.  Keep rooms and walkways well-lit. Activity   Follow a regular exercise program to stay fit. This will help you maintain your balance. Ask your health care provider what types of exercise are appropriate for  you.  If you need a cane or walker, use it as recommended by your health care provider.  Wear supportive shoes that have nonskid soles. Lifestyle  Do not drink alcohol if your health care provider tells you not to drink.  If you drink alcohol, limit how much you have: ? 0-1 drink a day for women. ? 0-2 drinks a day for men.  Be aware of how much alcohol is in your drink. In the [place] one drink  equals one typical bottle of beer (12 oz), one-half glass of wine (5 oz), or one shot of hard liquor (1 oz).  Do not use any products that contain nicotine or tobacco, such as cigarettes and e-cigarettes. If you need help quitting, ask your health care provider. Summary  Having a healthy lifestyle and getting preventive care can help to protect your health and wellness after age 74.  Screening and testing are the best way to find a health problem early and help you avoid having a fall. Early diagnosis and treatment give you the best chance for managing medical conditions that are more common for people who are older than age 58.  Falls are a major cause of broken bones and head injuries in people who are older than age 35. Take precautions to prevent a fall at home.  Work with your health care provider to learn what changes you can make to improve your health and wellness and to prevent falls. This information is not intended to replace advice given to you by your health care provider. Make sure you discuss any questions you have with your health care provider. Document Released: 02/18/2017 Document Revised: 02/18/2017 Document Reviewed: 02/18/2017 Elsevier Interactive Patient Education  2019 [employer].

## 2018-05-17 ENCOUNTER — Encounter: Payer: Self-pay | Admitting: Family Medicine

## 2018-05-17 ENCOUNTER — Other Ambulatory Visit: Payer: Self-pay

## 2018-05-17 ENCOUNTER — Telehealth: Payer: Self-pay | Admitting: Family Medicine

## 2018-05-17 DIAGNOSIS — D582 Other hemoglobinopathies: Secondary | ICD-10-CM

## 2018-05-17 DIAGNOSIS — R42 Dizziness and giddiness: Secondary | ICD-10-CM

## 2018-05-17 DIAGNOSIS — R55 Syncope and collapse: Secondary | ICD-10-CM

## 2018-05-17 NOTE — Telephone Encounter (Signed)
Please inform patient the following information: Her labs are all normal with the exception of mildly elevated hemoglobin. She had a similar results 2 years ago, and on repeat they were normal.  This can be seen if the sample is concentrated- meaning pt was fasting and dehydrated, or in some conditions where the body is making to many RBC.   I would recommend we repeat the sample with her well hydrated and after a meal etc (NOT fasting)- and check her iron as well. She can do this by lab appt only in 2-4 weeks with a provider appt to occur 3-4 days after lab collection so we can review all results together and discuss need for further eval.  Must schedule both appt at same time. Labs ordered.

## 2018-05-17 NOTE — Telephone Encounter (Signed)
Pt was called and given lab results. Appt was scheduled for labs to be repeated and appt to review the labs. Pt verbalized understanding in plan of action.

## 2018-06-07 ENCOUNTER — Other Ambulatory Visit (INDEPENDENT_AMBULATORY_CARE_PROVIDER_SITE_OTHER): Payer: BC Managed Care – PPO

## 2018-06-07 DIAGNOSIS — D582 Other hemoglobinopathies: Secondary | ICD-10-CM

## 2018-06-07 DIAGNOSIS — R42 Dizziness and giddiness: Secondary | ICD-10-CM

## 2018-06-07 DIAGNOSIS — R55 Syncope and collapse: Secondary | ICD-10-CM | POA: Diagnosis not present

## 2018-06-07 LAB — CBC WITH DIFFERENTIAL/PLATELET
Basophils Absolute: 0 10*3/uL (ref 0.0–0.1)
Basophils Relative: 0.4 % (ref 0.0–3.0)
Eosinophils Absolute: 0.4 10*3/uL (ref 0.0–0.7)
Eosinophils Relative: 6.3 % — ABNORMAL HIGH (ref 0.0–5.0)
HCT: 45.7 % (ref 36.0–46.0)
Hemoglobin: 15.2 g/dL — ABNORMAL HIGH (ref 12.0–15.0)
Lymphocytes Relative: 35 % (ref 12.0–46.0)
Lymphs Abs: 2.2 10*3/uL (ref 0.7–4.0)
MCHC: 33.2 g/dL (ref 30.0–36.0)
MCV: 92.4 fl (ref 78.0–100.0)
MONO ABS: 0.6 10*3/uL (ref 0.1–1.0)
Monocytes Relative: 10.1 % (ref 3.0–12.0)
Neutro Abs: 3 10*3/uL (ref 1.4–7.7)
Neutrophils Relative %: 48.2 % (ref 43.0–77.0)
PLATELETS: 338 10*3/uL (ref 150.0–400.0)
RBC: 4.95 Mil/uL (ref 3.87–5.11)
RDW: 14.2 % (ref 11.5–15.5)
WBC: 6.2 10*3/uL (ref 4.0–10.5)

## 2018-06-08 LAB — IRON,TIBC AND FERRITIN PANEL
%SAT: 40 % (calc) (ref 16–45)
Ferritin: 83 ng/mL (ref 16–288)
Iron: 123 ug/dL (ref 45–160)
TIBC: 311 ug/dL (ref 250–450)

## 2018-06-10 ENCOUNTER — Telehealth: Payer: Self-pay | Admitting: Family Medicine

## 2018-06-10 NOTE — Telephone Encounter (Signed)
Copied from [place] [id number]. Topic: Quick Communication - See Telephone Encounter >> Jun 10, 2018  1:31 PM Bea Graff, NT wrote: CRM for notification. See Telephone encounter for: 06/10/18. Pt would like to see if her lab results can be released so that she may view them on MyChart.

## 2018-06-11 ENCOUNTER — Ambulatory Visit: Payer: BC Managed Care – PPO | Admitting: Family Medicine

## 2018-06-11 NOTE — Telephone Encounter (Signed)
My Chart message was sent to patient informing her that labs may not be released until MD has reviewed them and approves them to be released.

## 2018-06-18 ENCOUNTER — Telehealth: Payer: Self-pay

## 2018-06-18 ENCOUNTER — Ambulatory Visit: Payer: BC Managed Care – PPO | Admitting: Family Medicine

## 2018-06-18 ENCOUNTER — Encounter: Payer: Self-pay | Admitting: Family Medicine

## 2018-06-18 VITALS — BP 119/73 | HR 64 | Temp 98.3°F | Resp 16 | Ht 60.5 in | Wt 152.4 lb

## 2018-06-18 DIAGNOSIS — E860 Dehydration: Secondary | ICD-10-CM

## 2018-06-18 DIAGNOSIS — R55 Syncope and collapse: Secondary | ICD-10-CM

## 2018-06-18 DIAGNOSIS — R42 Dizziness and giddiness: Secondary | ICD-10-CM | POA: Insufficient documentation

## 2018-06-18 HISTORY — DX: Dizziness and giddiness: R42

## 2018-06-18 HISTORY — DX: Syncope and collapse: R55

## 2018-06-18 NOTE — Patient Instructions (Signed)
INCREASE WATER--at least 80 ounces a day. I believe this is way you have syncope on the airplanes--- you are dehydrated.       Dehydration, Adult  Dehydration is when there is not enough fluid or water in your body. This happens when you lose more fluids than you take in. Dehydration can range from mild to very bad. It should be treated right away to keep it from getting very bad. Symptoms of mild dehydration may include:  Thirst.  Dry lips.  Slightly dry mouth.  Dry, warm skin.  Dizziness. Symptoms of moderate dehydration may include:  Very dry mouth.  Muscle cramps.  Dark pee (urine). Pee may be the color of tea.  Your body making less pee.  Your eyes making fewer tears.  Heartbeat that is uneven or faster than normal (palpitations).  Headache.  Light-headedness, especially when you stand up from sitting.  Fainting (syncope). Symptoms of very bad dehydration may include:  Changes in skin, such as: ? Cold and clammy skin. ? Blotchy (mottled) or pale skin. ? Skin that does not quickly return to normal after being lightly pinched and let go (poor skin turgor).  Changes in body fluids, such as: ? Feeling very thirsty. ? Your eyes making fewer tears. ? Not sweating when body temperature is high, such as in hot weather. ? Your body making very little pee.  Changes in vital signs, such as: ? Weak pulse. ? Pulse that is more than 100 beats a minute when you are sitting still. ? Fast breathing. ? Low blood pressure.  Other changes, such as: ? Sunken eyes. ? Cold hands and feet. ? Confusion. ? Lack of energy (lethargy). ? Trouble waking up from sleep. ? Short-term weight loss. ? Unconsciousness. Follow these instructions at home:   If told by your doctor, drink an ORS: ? Make an ORS by using instructions on the package. ? Start by drinking small amounts, about  cup (120 mL) every 5-10 minutes. ? Slowly drink more until you have had the amount that  your doctor said to have.  Drink enough clear fluid to keep your pee clear or pale yellow. If you were told to drink an ORS, finish the ORS first, then start slowly drinking clear fluids. Drink fluids such as: ? Water. Do not drink only water by itself. Doing that can make the salt (sodium) level in your body get too low (hyponatremia). ? Ice chips. ? Fruit juice that you have added water to (diluted). ? Low-calorie sports drinks.  Avoid: ? Alcohol. ? Drinks that have a lot of sugar. These include high-calorie sports drinks, fruit juice that does not have water added, and soda. ? Caffeine. ? Foods that are greasy or have a lot of fat or sugar.  Take over-the-counter and prescription medicines only as told by your doctor.  Do not take salt tablets. Doing that can make the salt level in your body get too high (hypernatremia).  Eat foods that have minerals (electrolytes). Examples include bananas, oranges, potatoes, tomatoes, and spinach.  Keep all follow-up visits as told by your doctor. This is important. Contact a doctor if:  You have belly (abdominal) pain that: ? Gets worse. ? Stays in one area (localizes).  You have a rash.  You have a stiff neck.  You get angry or annoyed more easily than normal (irritability).  You are more sleepy than normal.  You have a harder time waking up than normal.  You feel: ? Weak. ? Dizzy. ?  Very thirsty.  You have peed (urinated) only a small amount of very dark pee during 6-8 hours. Get help right away if:  You have symptoms of very bad dehydration.  You cannot drink fluids without throwing up (vomiting).  Your symptoms get worse with treatment.  You have a fever.  You have a very bad headache.  You are throwing up or having watery poop (diarrhea) and it: ? Gets worse. ? Does not go away.  You have blood or something green (bile) in your throw-up.  You have blood in your poop (stool). This may cause poop to look black and  tarry.  You have not peed in 6-8 hours.  You pass out (faint).  Your heart rate when you are sitting still is more than 100 beats a minute.  You have trouble breathing. This information is not intended to replace advice given to you by your health care provider. Make sure you discuss any questions you have with your health care provider. Document Released: 02/01/2009 Document Revised: 10/26/2015 Document Reviewed: 06/01/2015 Elsevier Interactive Patient Education  2019 [employer].

## 2018-06-18 NOTE — Progress Notes (Signed)
Jaime, Michael Jul 30, 1955, 63 y.o., female MRN: [id number] Patient Care Team    Relationship Specialty Notifications Start End  Ma Hillock, DO PCP - General Family Medicine  04/25/16   Revankar, Reita Cliche, MD Consulting Physician Cardiology  05/14/18     Chief Complaint  Patient presents with  . Follow-up    lab results, pt is not fasting.      Subjective: Pt presents for an OV to discuss her elevated hemoglobin. Elevated hemoglobin: Was seen for her preventative visit and labs resulted with the mildly elevated hemoglobin and hematocrit.  She is a non-smoker, does not have exposure to any secondhand smoke.  She does admit that she does not drink hardly any water.  She had a syncopal episode x2 on an airplane.  She has had cardiac work-up for this-she was negative.  She endorses today that she does not eat or drink before a long plane ride so that she does not have to use the public facilities.  Patient denies any snoring lung disease.  Patient with told to hydrate and labs were repeated prior to her appointment today.  Repeat labs with CBC unremarkable with the very mild exception of her hemoglobin of 15.2.  Depression screen [facility] 2/9 05/14/2018 01/26/2017 04/25/2016  Decreased Interest 0 0 0  Down, Depressed, Hopeless 0 0 0  PHQ - 2 Score 0 0 0    No Known Allergies Social History   Social History Narrative   Married to [place]. One child   PHD/professor   Drinks caffeine, uses herbal remedies.   Wears her seatbelt. Smoke detectors in the home.   Feels safe in her relationships.   No past medical history on file. Past Surgical History:  Procedure Laterality Date  . CESAREAN SECTION     Family History  Problem Relation Age of Onset  . Stomach cancer Father   . Colon cancer Neg Hx    Allergies as of 06/18/2018   No Known Allergies     Medication List       Accurate as of June 18, 2018  1:30 PM. Always use your most recent med list.        CALCIUM 500/D 500-400  MG-UNIT Chew Generic drug:  Calcium Carb-Cholecalciferol Chew 1 tablet by mouth 2 (two) times daily.   metoprolol tartrate 25 MG tablet Commonly known as:  LOPRESSOR TAKE 0.5 TABLET BY MOUTH TWICE DAILY. TAKE 1/2 TABLET IN THE MORNING AND EVENING       All past medical history, surgical history, allergies, family history, immunizations andmedications were updated in the EMR today and reviewed under the history and medication portions of their EMR.     ROS: Negative, with the exception of above mentioned in HPI   Objective:  BP 119/73 (BP Location: Left Arm, Patient Position: Sitting, Cuff Size: Normal)   Pulse 64   Temp 98.3 F (36.8 C) (Oral)   Resp 16   Ht 5' 0.5" (1.537 m)   Wt 152 lb 6 oz (69.1 kg)   SpO2 96%   BMI 29.27 kg/m  Body mass index is 29.27 kg/m. Gen: Afebrile. No acute distress. Nontoxic in appearance, well developed, well nourished.  CV: RRR  Chest: CTAB, no wheeze or crackles. Good air movement, normal resp effort.  Neuro:  Normal gait. PERLA. EOMi. Alert. Oriented x3  No exam data present No results found. No results found for this or any previous visit (from the past 24 hour(s)).  Assessment/Plan: Jaime Michael  is a 63 y.o. female present for OV for  Dehydration, mild Discussed with patient today her hemoglobin abnormality has improved.  Almost in normal parameters.  I do believe this is secondary to lack of adequate hydration.  SHe does not drink any water in a day.  I also believe her presyncopal and syncopal episodes on an airplane was likely secondary to dehydration. I asked her to attempt to increase her water intake to 80 ounces a day. Monitor urine color, if not light yellow, drink more water. -Follow-up PRN    Reviewed expectations re: course of current medical issues.  Discussed self-management of symptoms.  Outlined signs and symptoms indicating need for more acute intervention.  Patient verbalized understanding and all questions were  answered.  Patient received an After-Visit Summary.    No orders of the defined types were placed in this encounter.   > 15 minutes spent with patient, >50% of time spent face to face counseling     Note is dictated utilizing voice recognition software. Although note has been proof read prior to signing, occasional typographical errors still can be missed. If any questions arise, please do not hesitate to call for verification.   electronically signed by:  Howard Pouch, DO  [place]

## 2018-06-18 NOTE — Telephone Encounter (Signed)
Pt stated she received Shingles vaccine at [facility] off [address] in [place]. I called to get records from [facility] and they stated pt has not gotten medication from there or any local [facility]. Pt was called to ask if there was anywhere else that she may have gotten the vaccine from. Message was left for patient to return call.

## 2018-06-21 ENCOUNTER — Telehealth: Payer: Self-pay

## 2018-06-21 NOTE — Telephone Encounter (Signed)
Pt stated this was the older shingles vaccine and was not a two step vaccine per pharmacy back then.

## 2018-06-21 NOTE — Telephone Encounter (Signed)
Pt called and stated she went to [facility] and pharmacist stated they only keep records for up to 18 months and after looking back patient had Shingles vaccine Jan 2018. Pharmacist told pt that she should get the second one and pt wanted Dr Raoul Pitch to let her know if that is okay to get the second one  Please advise

## 2018-06-21 NOTE — Telephone Encounter (Signed)
I am assuming it was the shingrix she is stating she received. If so, then she should still get the second dose.

## 2018-06-22 NOTE — Telephone Encounter (Signed)
If she had the zostavax in Jan. 2018, then yes, she should also get the shingrix series. It has only been studied in those that received the shingrix 5 years after zostavax.  However literature states it is presumed safe regardless of timing- just not studied.  I do suggest she get the series in at least 2-3 years, but if she wanted to get it sooner it is "presumed" safe.

## 2018-06-22 NOTE — Telephone Encounter (Signed)
Pt was called and given information, she verbalized understanding  

## 2018-09-27 ENCOUNTER — Telehealth: Payer: Self-pay | Admitting: Cardiology

## 2018-09-27 ENCOUNTER — Other Ambulatory Visit: Payer: Self-pay | Admitting: *Deleted

## 2018-09-27 MED ORDER — METOPROLOL TARTRATE 25 MG PO TABS: ORAL_TABLET | ORAL | 0 refills | Status: DC

## 2018-09-27 NOTE — Telephone Encounter (Signed)
°*  STAT* If patient is at the pharmacy, call can be transferred to refill team.   1. Which medications need to be refilled? (please list name of each medication and dose if known) metoprolol tartrate (LOPRESSOR) 25 MG tablet   2. Which pharmacy/location (including street and city if local pharmacy) is medication to be sent to?  [place], [place] [address] [phone] (Phone) [phone] (Fax)    3. Do they need a 30 day or 90 day supply? 90 day

## 2018-12-24 ENCOUNTER — Other Ambulatory Visit: Payer: Self-pay | Admitting: Cardiology

## 2018-12-30 ENCOUNTER — Other Ambulatory Visit: Payer: Self-pay | Admitting: Cardiology

## 2018-12-30 ENCOUNTER — Other Ambulatory Visit: Payer: Self-pay

## 2018-12-30 MED ORDER — METOPROLOL TARTRATE 25 MG PO TABS: ORAL_TABLET | ORAL | 0 refills | Status: DC

## 2018-12-30 NOTE — Telephone Encounter (Signed)
Patient will not come to office, only will do virtual visit which is set up for 9/18. Please put a 30 day refill in for metoprolol.

## 2019-01-07 ENCOUNTER — Encounter: Payer: Self-pay | Admitting: Cardiology

## 2019-01-07 ENCOUNTER — Telehealth (INDEPENDENT_AMBULATORY_CARE_PROVIDER_SITE_OTHER): Payer: BC Managed Care – PPO | Admitting: Cardiology

## 2019-01-07 ENCOUNTER — Other Ambulatory Visit: Payer: Self-pay

## 2019-01-07 VITALS — BP 132/74 | HR 70 | Ht 60.0 in | Wt 150.0 lb

## 2019-01-07 DIAGNOSIS — R002 Palpitations: Secondary | ICD-10-CM | POA: Diagnosis not present

## 2019-01-07 NOTE — Patient Instructions (Signed)

## 2019-01-07 NOTE — Progress Notes (Signed)
Virtual Visit via Video Note   This visit type was conducted due to national recommendations for restrictions regarding the COVID-19 Pandemic (e.g. social distancing) in an effort to limit this patient's exposure and mitigate transmission in our community.  Due to her co-morbid illnesses, this patient is at least at moderate risk for complications without adequate follow up.  This format is felt to be most appropriate for this patient at this time.  All issues noted in this document were discussed and addressed.  A limited physical exam was performed with this format.  Please refer to the patient's chart for her consent to telehealth for [facility].   Date:  01/07/2019   ID:  Jaime Michael, DOB [birth year], MRN [id number]  Patient Location: Home Provider Location: Office  PCP:  Ma Hillock, DO  Cardiologist:  No primary care provider on file.  Electrophysiologist:  None   Evaluation Performed:  Follow-Up Visit  Chief Complaint: Palpitations  History of Present Illness:    Jaime Michael is a 63 y.o. female with past medical history that is not much significant she has history of palpitation and this is well resolved with beta-blockers and regular exercise.  She underwent stress testing which was unremarkable.  She is an active lady.  She denies any chest pain orthopnea or PND.  She is happy to know that her palpitations have resolved.  At the time of my evaluation, the patient is alert awake oriented and in no distress.  The patient does not have symptoms concerning for COVID-19 infection (fever, chills, cough, or new shortness of breath).    No past medical history on file. Past Surgical History:  Procedure Laterality Date  . CESAREAN SECTION       Current Meds  Medication Sig  . Calcium Carb-Cholecalciferol (CALCIUM 500/D) 500-400 MG-UNIT CHEW Chew 1 tablet by mouth 2 (two) times daily.   . metoprolol tartrate (LOPRESSOR) 25 MG tablet TAKE 0.5 TABLET BY MOUTH TWICE DAILY.  TAKE 1/2 TABLET IN THE MORNING AND EVENING     Allergies:   Patient has no known allergies.   Social History   Tobacco Use  . Smoking status: Never Smoker  . Smokeless tobacco: Never Used  Substance Use Topics  . Alcohol use: No  . Drug use: No     Family Hx: The patient's family history includes Stomach cancer in her father. There is no history of Colon cancer.  ROS:   Please see the history of present illness.    As mentioned above All other systems reviewed and are negative.   Prior CV studies:   The following studies were reviewed today:  Stress test report was discussed with the patient  Labs/Other Tests and Data Reviewed:    EKG:  EKG reveals sinus rhythm and nonspecific ST-T changes  Recent Labs: 05/14/2018: ALT 14; BUN 12; Creatinine, Ser 0.95; Potassium 4.7; Sodium 144; TSH 1.30 06/07/2018: Hemoglobin 15.2; Platelets 338.0   Recent Lipid Panel Lab Results  Component Value Date/Time   CHOL 190 05/14/2018 09:28 AM   TRIG 108.0 05/14/2018 09:28 AM   HDL 70.50 05/14/2018 09:28 AM   CHOLHDL 3 05/14/2018 09:28 AM   LDLCALC 97 05/14/2018 09:28 AM    Wt Readings from Last 3 Encounters:  01/07/19 150 lb (68 kg)  06/18/18 152 lb 6 oz (69.1 kg)  05/14/18 150 lb 6 oz (68.2 kg)     Objective:    Vital Signs:  BP 132/74 Comment: per patient BP 132/74 on 01/05/19  Pulse 70   Ht 5' (1.524 m)   Wt 150 lb (68 kg)   BMI 29.29 kg/m    VITAL SIGNS:  reviewed  ASSESSMENT & PLAN:    1. Palpitations: These have resolved and the patient is very happy about it.  Importance of regular exercise stressed.  She walks half an hour on a regular basis and ambulated to moderate her symptoms have completely resolved and she is very relieved about it.  I discussed blood work including lipids with her.  Diet was discussed.Patient will be seen in follow-up appointment in 6 months or earlier if the patient has any concerns   COVID-19 Education: The signs and symptoms of COVID-19  were discussed with the patient and how to seek care for testing (follow up with PCP or arrange E-visit).  The importance of social distancing was discussed today.  Time:   Today, I have spent 15 minutes with the patient with telehealth technology discussing the above problems.     Medication Adjustments/Labs and Tests Ordered: Current medicines are reviewed at length with the patient today.  Concerns regarding medicines are outlined above.   Tests Ordered: No orders of the defined types were placed in this encounter.   Medication Changes: No orders of the defined types were placed in this encounter.   Follow Up:  Virtual Visit or In Person in 6 month(s)  Signed, Jenean Lindau, MD  01/07/2019 11:56 AM    [place]

## 2019-03-27 ENCOUNTER — Other Ambulatory Visit: Payer: Self-pay | Admitting: Cardiology

## 2019-06-25 ENCOUNTER — Other Ambulatory Visit: Payer: Self-pay | Admitting: Cardiology

## 2019-07-07 ENCOUNTER — Other Ambulatory Visit: Payer: Self-pay

## 2019-07-07 ENCOUNTER — Encounter: Payer: Self-pay | Admitting: Cardiology

## 2019-07-07 ENCOUNTER — Ambulatory Visit (INDEPENDENT_AMBULATORY_CARE_PROVIDER_SITE_OTHER): Payer: BC Managed Care – PPO | Admitting: Cardiology

## 2019-07-07 VITALS — BP 118/80 | HR 59 | Ht 60.0 in | Wt 157.0 lb

## 2019-07-07 DIAGNOSIS — R42 Dizziness and giddiness: Secondary | ICD-10-CM | POA: Diagnosis not present

## 2019-07-07 DIAGNOSIS — R55 Syncope and collapse: Secondary | ICD-10-CM | POA: Diagnosis not present

## 2019-07-07 DIAGNOSIS — R002 Palpitations: Secondary | ICD-10-CM | POA: Diagnosis not present

## 2019-07-07 NOTE — Patient Instructions (Signed)

## 2019-07-07 NOTE — Progress Notes (Signed)
Cardiology Office Note:    Date:  07/07/2019   Cardiology Office Note:    Date:  07/07/2019   ID:  [name], DOB 02-03-1956, MRN [id number]  PCP:  Ma Hillock, DO  Cardiologist:  Jenean Lindau, MD   Referring MD: Ma Hillock, DO    ASSESSMENT:    1. Postural dizziness with presyncope   2. Palpitations    PLAN:    In order of problems listed above:  In order of problems listed above:  1. Primary prevention stressed with the patient.  Importance of compliance with diet and medication stressed and she vocalized understanding.  She has an excellent exercise program and I congratulated her. 2. Palpitations: She feels much better and these have resolved significantly and she is happy about it. 3. Lipids were reviewed with her and she is very happy about her diet and exercise.  She will continue current strategy and plan plans to diet better to lose weight 4. Patient will be seen in follow-up appointment in 6 months or earlier if the patient has any concerns   Medication Adjustments/Labs and Tests Ordered: Current medicines are reviewed at length with the patient today.  Concerns regarding medicines are outlined above.  Orders Placed This Encounter  Procedures  . EKG 12-Lead   No orders of the defined types were placed in this encounter.    Chief Complaint  Patient presents with  . Follow-up     History of Present Illness:    Jaime Michael is a 64 y.o. female.  Patient was evaluated by me for palpitations.  She denies any problems today.  She occasionally has palpitations which she says are very mild compared to what she used to have before.  She uses a beta-blocker on a as needed basis.  No chest pain orthopnea or PND.  At the time of my evaluation, the patient is alert awake oriented and in no distress.  Past Medical History:  Diagnosis Date  . Encounter for preventive health examination 04/25/2016  . Palpitations 01/27/2017  . Postmenopausal 05/12/2016  .  Postural dizziness with presyncope 06/18/2018  . Vitamin D deficiency 04/28/2016    Past Surgical History:  Procedure Laterality Date  . CESAREAN SECTION      Current Medications: Current Meds  Medication Sig  . Calcium Carb-Cholecalciferol (CALCIUM 500/D) 500-400 MG-UNIT CHEW Chew 1 tablet by mouth 2 (two) times daily.   . metoprolol tartrate (LOPRESSOR) 25 MG tablet TAKE 1/2 TABLET BY MOUTH TWICE DAILY IN THE MORNING AND IN THE EVENING     Allergies:   Patient has no known allergies.   Social History   Socioeconomic History  . Marital status: Married    Spouse name: Ahamd  . Number of children: 1  . Years of education: PhD  . Highest education level: Not on file  Occupational History  . Occupation: Pharmacist, hospital  Tobacco Use  . Smoking status: Never Smoker  . Smokeless tobacco: Never Used  Substance and Sexual Activity  . Alcohol use: No  . Drug use: No  . Sexual activity: Never    Partners: Male    Birth control/protection: None    Comment: Married  Other Topics Concern  . Not on file  Social History Narrative   Married to [place]. One child   PHD/professor   Drinks caffeine, uses herbal remedies.   Wears her seatbelt. Smoke detectors in the home.   Feels safe in her relationships.   Social Determinants of Health  Financial Resource Strain:   . Difficulty of Paying Living Expenses:   Food Insecurity:   . Worried About Charity fundraiser in the Last Year:   . Arboriculturist in the Last Year:   Transportation Needs:   . Film/video editor (Medical):   Marland Kitchen Lack of Transportation (Non-Medical):   Physical Activity:   . Days of Exercise per Week:   . Minutes of Exercise per Session:   Stress:   . Feeling of Stress :   Social Connections:   . Frequency of Communication with Friends and Family:   . Frequency of Social Gatherings with Friends and Family:   . Attends Religious Services:   . Active Member of Clubs or Organizations:   . Attends Theatre manager Meetings:   Marland Kitchen Marital Status:      Family History: The patient's family history includes Stomach cancer in her father. There is no history of Colon cancer.  ROS:   Please see the history of present illness.    All other systems reviewed and are negative.  EKGs/Labs/Other Studies Reviewed:    The following studies were reviewed today: EKG reveals sinus rhythm and nonspecific ST-T changes.   Recent Labs: No results found for requested labs within last 8760 hours.  Recent Lipid Panel    Component Value Date/Time   CHOL 190 05/14/2018 0928   TRIG 108.0 05/14/2018 0928   HDL 70.50 05/14/2018 0928   CHOLHDL 3 05/14/2018 0928   VLDL 21.6 05/14/2018 0928   LDLCALC 97 05/14/2018 0928    Physical Exam:    VS:  BP 118/80   Pulse (!) 59   Ht 5' (1.524 m)   Wt 157 lb (71.2 kg)   SpO2 96%   BMI 30.66 kg/m     Wt Readings from Last 3 Encounters:  07/07/19 157 lb (71.2 kg)  01/07/19 150 lb (68 kg)  06/18/18 152 lb 6 oz (69.1 kg)     GEN: Patient is in no acute distress HEENT: Normal NECK: No JVD; No carotid bruits LYMPHATICS: No lymphadenopathy CARDIAC: Hear sounds regular, 2/6 systolic murmur at the apex. RESPIRATORY:  Clear to auscultation without rales, wheezing or rhonchi  ABDOMEN: Soft, non-tender, non-distended MUSCULOSKELETAL:  No edema; No deformity  SKIN: Warm and dry NEUROLOGIC:  Alert and oriented x 3 PSYCHIATRIC:  Normal affect   Signed, Jenean Lindau, MD  07/07/2019 11:43 AM    [place]

## 2019-12-22 ENCOUNTER — Other Ambulatory Visit: Payer: Self-pay | Admitting: Cardiology

## 2020-01-12 ENCOUNTER — Ambulatory Visit: Payer: BC Managed Care – PPO | Admitting: Cardiology

## 2020-01-31 ENCOUNTER — Encounter: Payer: Self-pay | Admitting: Cardiology

## 2020-01-31 ENCOUNTER — Ambulatory Visit: Payer: BC Managed Care – PPO | Admitting: Cardiology

## 2020-01-31 ENCOUNTER — Other Ambulatory Visit: Payer: Self-pay

## 2020-01-31 VITALS — BP 126/82 | HR 68 | Ht 61.0 in | Wt 149.0 lb

## 2020-01-31 DIAGNOSIS — R002 Palpitations: Secondary | ICD-10-CM

## 2020-01-31 NOTE — Patient Instructions (Signed)

## 2020-01-31 NOTE — Progress Notes (Signed)
Cardiology Office Note:    Date:  01/31/2020   ID:  Jaime Michael, DOB 1955-08-04, MRN [id number]  PCP:  Ma Hillock, DO  Cardiologist:  Jenean Lindau, MD   Referring MD: Ma Hillock, DO    ASSESSMENT:    1. Palpitations    PLAN:    In order of problems listed above:  1. Primary prevention stressed with patient.  Importance of compliance with diet medication stressed and she vocalized understanding. 2. Palpitations: These have resolved.  She is happy about it.  She will continue low-dose beta-blocker.  She is going to have a physical done with her primary care physician in the next few days and get all blood work send me a copy. 3. Patient will be seen in follow-up appointment in 6 months or earlier if the patient has any concerns 4. Last available blood work from 2020 was reviewed and discussed with her.  Questions were answered to her satisfaction.   Medication Adjustments/Labs and Tests Ordered: Current medicines are reviewed at length with the patient today.  Concerns regarding medicines are outlined above.  No orders of the defined types were placed in this encounter.  No orders of the defined types were placed in this encounter.    No chief complaint on file.    History of Present Illness:    Jaime Michael is a 64 y.o. female.  Patient has past medical history of palpitations.  She has no history of hypertension dyslipidemia or diabetes mellitus.  She takes a small dose of beta-blocker with good relief of her symptoms.  She is happy about it.  No chest pain orthopnea or PND.  She exercises on a regular basis.  At the time of my evaluation, the patient is alert awake oriented and in no distress.  Past Medical History:  Diagnosis Date  . Encounter for preventive health examination 04/25/2016  . Palpitations 01/27/2017  . Postmenopausal 05/12/2016  . Postural dizziness with presyncope 06/18/2018  . Vitamin D deficiency 04/28/2016    Past Surgical History:    Procedure Laterality Date  . CESAREAN SECTION      Current Medications: Current Meds  Medication Sig  . Calcium Carb-Cholecalciferol (CALCIUM 500/D) 500-400 MG-UNIT CHEW Chew 1 tablet by mouth 2 (two) times daily.   . metoprolol tartrate (LOPRESSOR) 25 MG tablet TAKE 1/2 TABLET BY MOUTH TWICE DAILY IN THE MORNING AND IN THE EVENING     Allergies:   Patient has no known allergies.   Social History   Socioeconomic History  . Marital status: Married    Spouse name: Ahamd  . Number of children: 1  . Years of education: PhD  . Highest education level: Not on file  Occupational History  . Occupation: Pharmacist, hospital  Tobacco Use  . Smoking status: Never Smoker  . Smokeless tobacco: Never Used  Vaping Use  . Vaping Use: Never used  Substance and Sexual Activity  . Alcohol use: No  . Drug use: No  . Sexual activity: Never    Partners: Male    Birth control/protection: None    Comment: Married  Other Topics Concern  . Not on file  Social History Narrative   Married to [place]. One child   PHD/professor   Drinks caffeine, uses herbal remedies.   Wears her seatbelt. Smoke detectors in the home.   Feels safe in her relationships.   Social Determinants of Health   Financial Resource Strain:   . Difficulty of Paying Living Expenses: Not on  file  Food Insecurity:   . Worried About Charity fundraiser in the Last Year: Not on file  . Ran Out of Food in the Last Year: Not on file  Transportation Needs:   . Lack of Transportation (Medical): Not on file  . Lack of Transportation (Non-Medical): Not on file  Physical Activity:   . Days of Exercise per Week: Not on file  . Minutes of Exercise per Session: Not on file  Stress:   . Feeling of Stress : Not on file  Social Connections:   . Frequency of Communication with Friends and Family: Not on file  . Frequency of Social Gatherings with Friends and Family: Not on file  . Attends Religious Services: Not on file  . Active Member of  Clubs or Organizations: Not on file  . Attends Archivist Meetings: Not on file  . Marital Status: Not on file     Family History: The patient's family history includes Stomach cancer in her father. There is no history of Colon cancer.  ROS:   Please see the history of present illness.    All other systems reviewed and are negative.  EKGs/Labs/Other Studies Reviewed:    The following studies were reviewed today: I discussed my findings with the patient at length.   Recent Labs: No results found for requested labs within last 8760 hours.  Recent Lipid Panel    Component Value Date/Time   CHOL 190 05/14/2018 0928   TRIG 108.0 05/14/2018 0928   HDL 70.50 05/14/2018 0928   CHOLHDL 3 05/14/2018 0928   VLDL 21.6 05/14/2018 0928   LDLCALC 97 05/14/2018 0928    Physical Exam:    VS:  BP 126/82   Pulse 68   Ht 5\' 1"  (1.549 m)   Wt 149 lb 0.2 oz (67.6 kg)   SpO2 98%   BMI 28.16 kg/m     Wt Readings from Last 3 Encounters:  01/31/20 149 lb 0.2 oz (67.6 kg)  07/07/19 157 lb (71.2 kg)  01/07/19 150 lb (68 kg)     GEN: Patient is in no acute distress HEENT: Normal NECK: No JVD; No carotid bruits LYMPHATICS: No lymphadenopathy CARDIAC: Hear sounds regular, 2/6 systolic murmur at the apex. RESPIRATORY:  Clear to auscultation without rales, wheezing or rhonchi  ABDOMEN: Soft, non-tender, non-distended MUSCULOSKELETAL:  No edema; No deformity  SKIN: Warm and dry NEUROLOGIC:  Alert and oriented x 3 PSYCHIATRIC:  Normal affect   Signed, Jenean Lindau, MD  01/31/2020 11:56 AM    [place]

## 2020-06-21 ENCOUNTER — Other Ambulatory Visit: Payer: Self-pay | Admitting: Cardiology

## 2020-06-21 NOTE — Telephone Encounter (Signed)
Rx refill sent to pharmacy.  Left message for pt to call us back and make follow up appt with Dr. Geraldo Pitter in April. There was no appt set and no recall put in system.

## 2020-10-04 ENCOUNTER — Other Ambulatory Visit: Payer: Self-pay

## 2020-10-05 ENCOUNTER — Ambulatory Visit (INDEPENDENT_AMBULATORY_CARE_PROVIDER_SITE_OTHER): Payer: BC Managed Care – PPO | Admitting: Family Medicine

## 2020-10-05 ENCOUNTER — Encounter: Payer: Self-pay | Admitting: Family Medicine

## 2020-10-05 VITALS — BP 113/67 | HR 57 | Temp 97.6°F | Ht 59.84 in | Wt 146.2 lb

## 2020-10-05 DIAGNOSIS — M21071 Valgus deformity, not elsewhere classified, right ankle: Secondary | ICD-10-CM

## 2020-10-05 DIAGNOSIS — M2141 Flat foot [pes planus] (acquired), right foot: Secondary | ICD-10-CM

## 2020-10-05 DIAGNOSIS — Z0000 Encounter for general adult medical examination without abnormal findings: Secondary | ICD-10-CM | POA: Diagnosis not present

## 2020-10-05 DIAGNOSIS — Z1231 Encounter for screening mammogram for malignant neoplasm of breast: Secondary | ICD-10-CM

## 2020-10-05 DIAGNOSIS — Z13220 Encounter for screening for lipoid disorders: Secondary | ICD-10-CM

## 2020-10-05 DIAGNOSIS — Z130 Encounter for screening for diseases of the blood and blood-forming organs and certain disorders involving the immune mechanism: Secondary | ICD-10-CM | POA: Diagnosis not present

## 2020-10-05 DIAGNOSIS — Z131 Encounter for screening for diabetes mellitus: Secondary | ICD-10-CM

## 2020-10-05 DIAGNOSIS — E559 Vitamin D deficiency, unspecified: Secondary | ICD-10-CM | POA: Diagnosis not present

## 2020-10-05 DIAGNOSIS — R002 Palpitations: Secondary | ICD-10-CM

## 2020-10-05 DIAGNOSIS — E7800 Pure hypercholesterolemia, unspecified: Secondary | ICD-10-CM | POA: Diagnosis not present

## 2020-10-05 HISTORY — DX: Flat foot (pes planus) (acquired), right foot: M21.41

## 2020-10-05 HISTORY — DX: Valgus deformity, not elsewhere classified, right ankle: M21.071

## 2020-10-05 LAB — LIPID PANEL
Cholesterol: 179 mg/dL (ref 0–200)
HDL: 77.3 mg/dL (ref >39.00)
LDL Cholesterol: 86 mg/dL (ref 0–99)
NonHDL: 101.55
Total CHOL/HDL Ratio: 2
Triglycerides: 76 mg/dL (ref 0.0–149.0)
VLDL: 15.2 mg/dL (ref 0.0–40.0)

## 2020-10-05 LAB — CBC WITH DIFFERENTIAL/PLATELET
Basophils Absolute: 0.2 10*3/uL — ABNORMAL HIGH (ref 0.0–0.1)
Basophils Relative: 3 % (ref 0.0–3.0)
Eosinophils Absolute: 0.4 10*3/uL (ref 0.0–0.7)
Eosinophils Relative: 6.6 % — ABNORMAL HIGH (ref 0.0–5.0)
HCT: 45.7 % (ref 36.0–46.0)
Hemoglobin: 15.1 g/dL — ABNORMAL HIGH (ref 12.0–15.0)
Lymphocytes Relative: 31 % (ref 12.0–46.0)
Lymphs Abs: 1.9 10*3/uL (ref 0.7–4.0)
MCHC: 33.1 g/dL (ref 30.0–36.0)
MCV: 92 fl (ref 78.0–100.0)
Monocytes Absolute: 0.5 10*3/uL (ref 0.1–1.0)
Monocytes Relative: 8.1 % (ref 3.0–12.0)
Neutro Abs: 3.1 10*3/uL (ref 1.4–7.7)
Neutrophils Relative %: 51.3 % (ref 43.0–77.0)
Platelets: 412 10*3/uL — ABNORMAL HIGH (ref 150.0–400.0)
RBC: 4.97 Mil/uL (ref 3.87–5.11)
RDW: 14.7 % (ref 11.5–15.5)
WBC: 6.1 10*3/uL (ref 4.0–10.5)

## 2020-10-05 LAB — COMPREHENSIVE METABOLIC PANEL
ALT: 16 U/L (ref 0–35)
AST: 15 U/L (ref 0–37)
Albumin: 4.1 g/dL (ref 3.5–5.2)
Alkaline Phosphatase: 49 U/L (ref 39–117)
BUN: 14 mg/dL (ref 6–23)
CO2: 30 mEq/L (ref 19–32)
Calcium: 9.5 mg/dL (ref 8.4–10.5)
Chloride: 106 mEq/L (ref 96–112)
Creatinine, Ser: 0.98 mg/dL (ref 0.40–1.20)
GFR: 60.93 mL/min (ref >60.00)
Glucose, Bld: 97 mg/dL (ref 70–99)
Potassium: 4.7 mEq/L (ref 3.5–5.1)
Sodium: 142 mEq/L (ref 135–145)
Total Bilirubin: 0.6 mg/dL (ref 0.2–1.2)
Total Protein: 6.7 g/dL (ref 6.0–8.3)

## 2020-10-05 LAB — TSH: TSH: 1.21 u[IU]/mL (ref 0.35–4.50)

## 2020-10-05 LAB — VITAMIN D 25 HYDROXY (VIT D DEFICIENCY, FRACTURES): VITD: 22.31 ng/mL — ABNORMAL LOW (ref 30.00–100.00)

## 2020-10-05 LAB — HEMOGLOBIN A1C: Hgb A1c MFr Bld: 5.7 % (ref 4.6–6.5)

## 2020-10-05 NOTE — Progress Notes (Signed)
[address]  This visit occurred during the SARS-CoV-2 public health emergency.  Safety protocols were in place, including screening questions prior to the visit, additional usage of staff PPE, and extensive cleaning of exam room while observing appropriate contact time as indicated for disinfecting solutions.    Patient ID: Jaime Michael, female  DOB: May 20, 1955, 65 y.o.   MRN: [id number] Patient Care Team    Relationship Specialty Notifications Start End  Ma Hillock, DO PCP - General Family Medicine  04/25/16   Revankar, Reita Cliche, MD Consulting Physician Cardiology  05/14/18     Chief Complaint  Patient presents with   Annual Exam    Pt is fasting    Subjective:  Jaime Michael is a 65 y.o.  Female  present for CPE . All past medical history, surgical history, allergies, family history, immunizations, medications and social history were updated in the electronic medical record today. All recent labs, ED visits and hospitalizations within the last year were reviewed. Health maintenance:  Colonoscopy: No family history, completed 06/25/2016. Jaime Michael, polyp, 5 year.  Mammogram: 07/09/2016; birads 1. No family history. [facility] Cervical cancer screening: 05/12/2016; Normal and neg co test; Jaime Michael Immunizations: tdap UTD 2018. Declined flu shot. Shingrix completed, covid x3 Infectious disease screening: HIV and Hep C completed  DEXA: 2018- normal Assistive device: none Oxygen QAS:TMHD Patient has a Dental home. Hospitalizations/ED visits: review  Right foot pain: pt reports her right medial ankle has be causing her discomfort for "awhile"- at least a year or more. She reports it is worse after being on her foot for longer periods of time and first thing in the morning. She reports she sometimes tapes her foot/ankle and places napkins under her arch- and it takes away her pain.   Depression screen [facility] 2/9 10/05/2020 05/14/2018 01/26/2017 04/25/2016  Decreased Interest 0 0 0 0  Down, Depressed,  Hopeless 0 0 0 0  PHQ - 2 Score 0 0 0 0   GAD 7 : Generalized Anxiety Score 10/05/2020  Nervous, Anxious, on Edge 0  Control/stop worrying 1  Worry too much - different things 1  Trouble relaxing 0  Restless 0  Easily annoyed or irritable 0  Afraid - awful might happen 0  Total GAD 7 Score 2     Immunization History  Administered Date(s) Administered   PFIZER(Purple Top)SARS-COV-2 Vaccination 07/01/2019, 07/22/2019, 04/23/2020   Tdap 04/25/2016   Zoster Recombinat (Shingrix) 06/21/2020, 08/29/2020    Past Medical History:  Diagnosis Date   Encounter for preventive health examination 04/25/2016   Palpitations 01/27/2017   Postmenopausal 05/12/2016   Postural dizziness with presyncope 06/18/2018   Vitamin D deficiency 04/28/2016   No Known Allergies Past Surgical History:  Procedure Laterality Date   CESAREAN SECTION     Family History  Problem Relation Age of Onset   Stomach cancer Father    Colon cancer Neg Hx    Social History   Social History Narrative   Married to Jaime Michael. One child   PHD/professor   Drinks caffeine, uses herbal remedies.   Wears her seatbelt. Smoke detectors in the home.   Feels safe in her relationships.    Allergies as of 10/05/2020   No Known Allergies      Medication List        Accurate as of October 05, 2020  8:40 AM. If you have any questions, ask your nurse or doctor.          Calcium Carb-Cholecalciferol 500-400 MG-UNIT Chew Chew 1  tablet by mouth 2 (two) times daily.   metoprolol tartrate 25 MG tablet Commonly known as: LOPRESSOR TAKE 1/2 TABLET BY MOUTH TWICE DAILY IN THE MORNING AND IN THE EVENING        All past medical history, surgical history, allergies, family history, immunizations andmedications were updated in the EMR today and reviewed under the history and medication portions of their EMR.     No results found for this or any previous visit (from the past 2160 hour(s)).  ROS: 14 pt review of systems performed  and negative (unless mentioned in an HPI)  Objective: BP 113/67   Pulse (!) 57   Temp 97.6 F (36.4 C)   Ht 4' 11.84" (1.52 m)   Wt 146 lb 3.2 oz (66.3 kg)   SpO2 97%   BMI 28.70 kg/m  Gen: Afebrile. No acute distress. Nontoxic in appearance, well-developed, well-nourished,  pleasant female  HENT: AT. [place]. Bilateral TM visualized and normal in appearance, normal external auditory canal. MMM, no oral lesions, adequate dentition. Bilateral nares within normal limits. Throat without erythema, ulcerations or exudates. no Cough on exam, no hoarseness on exam. Eyes:Pupils Equal Round Reactive to light, Extraocular movements intact,  Conjunctiva without redness, discharge or icterus. Neck/lymp/endocrine: Supple,no lymphadenopathy, no thyromegaly CV: RRR no murmur, no edema, +2/4 P posterior tibialis pulses.  Chest: CTAB, no wheeze, rhonchi or crackles. normal Respiratory effort. good Air movement Abd: Soft. NTND. BS present. no Masses palpated. No hepatosplenomegaly. No rebound tenderness or guarding. Skin: no rashes, purpura or petechiae. Warm and well-perfused. Skin intact. Neuro/Msk: right foot with loss of medial arch and ankle with valgus deformity.   PERLA. EOMi. Alert. Oriented x3.  Cranial nerves II through XII intact. Muscle strength 5/5 upper/lower extremity. DTRs equal bilaterally. Psych: Normal affect, dress and demeanor. Normal speech. Normal thought content and judgment.   No results found.  Assessment/plan: Jaime Michael is a 65 y.o. 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  Discussed referral for eval and orthotics and she is agreeable to this approach today.  Referral to [facility] Encounter for preventive health examination Patient was encouraged to exercise greater than 150 minutes a week. Patient was encouraged to choose a diet filled with fresh fruits and vegetables, and lean meats. AVS provided to patient today for education/recommendation on gender specific health and safety maintenance. Colonoscopy: No family history, completed 06/25/2016. Jaime Michael, polyp, 5 year.  Mammogram: 07/09/2016; birads 1. No family history. [facility] Cervical cancer screening: 05/12/2016; Normal and neg co test; Jaime Michael Immunizations: tdap UTD 2018. Declined flu shot. Shingrix completed, covid x3 Infectious disease screening: HIV and Hep C completed  DEXA: 2018- normal  Return in about 1 year (around 10/05/2021) for CPE (30 min).   Orders Placed This Encounter  Procedures   MM 3D SCREEN BREAST BILATERAL   CBC with Differential/Platelet   Comprehensive metabolic panel   Hemoglobin A1c   Lipid panel   VITAMIN D 25 Hydroxy (Vit-D Deficiency, Fractures)   TSH   No orders of the defined types were placed in this encounter.  Referral Orders  No referral(s) requested today     Electronically signed  by: Howard Pouch, DO [place]

## 2020-10-05 NOTE — Patient Instructions (Signed)
Health Maintenance, Female Adopting a healthy lifestyle and getting preventive care are important in promoting health and wellness. Ask your health care provider about: The right schedule for you to have regular tests and exams. Things you can do on your own to prevent diseases and keep yourself healthy. What should I know about diet, weight, and exercise? Eat a healthy diet  Eat a diet that includes plenty of vegetables, fruits, low-fat dairy products, and lean protein. Do not eat a lot of foods that are high in solid fats, added sugars, or sodium.  Maintain a healthy weight Body mass index (BMI) is used to identify weight problems. It estimates body fat based on height and weight. Your health care provider can help determineyour BMI and help you achieve or maintain a healthy weight. Get regular exercise Get regular exercise. This is one of the most important things you can do for your health. Most adults should: Exercise for at least 150 minutes each week. The exercise should increase your heart rate and make you sweat (moderate-intensity exercise). Do strengthening exercises at least twice a week. This is in addition to the moderate-intensity exercise. Spend less time sitting. Even light physical activity can be beneficial. Watch cholesterol and blood lipids Have your blood tested for lipids and cholesterol at 65 years of age, then havethis test every 5 years. Have your cholesterol levels checked more often if: Your lipid or cholesterol levels are high. You are older than 65 years of age. You are at high risk for heart disease. What should I know about cancer screening? Depending on your health history and family history, you may need to have cancer screening at various ages. This may include screening for: Breast cancer. Cervical cancer. Colorectal cancer. Skin cancer. Lung cancer. What should I know about heart disease, diabetes, and high blood pressure? Blood pressure and heart  disease High blood pressure causes heart disease and increases the risk of stroke. This is more likely to develop in people who have high blood pressure readings, are of African descent, or are overweight. Have your blood pressure checked: Every 3-5 years if you are 18-39 years of age. Every year if you are 40 years old or older. Diabetes Have regular diabetes screenings. This checks your fasting blood sugar level. Have the screening done: Once every three years after age 40 if you are at a normal weight and have a low risk for diabetes. More often and at a younger age if you are overweight or have a high risk for diabetes. What should I know about preventing infection? Hepatitis B If you have a higher risk for hepatitis B, you should be screened for this virus. Talk with your health care provider to find out if you are at risk forhepatitis B infection. Hepatitis C Testing is recommended for: Everyone born from 1945 through 1965. Anyone with known risk factors for hepatitis C. Sexually transmitted infections (STIs) Get screened for STIs, including gonorrhea and chlamydia, if: You are sexually active and are younger than 65 years of age. You are older than 65 years of age and your health care provider tells you that you are at risk for this type of infection. Your sexual activity has changed since you were last screened, and you are at increased risk for chlamydia or gonorrhea. Ask your health care provider if you are at risk. Ask your health care provider about whether you are at high risk for HIV. Your health care provider may recommend a prescription medicine to help   prevent HIV infection. If you choose to take medicine to prevent HIV, you should first get tested for HIV. You should then be tested every 3 months for as long as you are taking the medicine. Pregnancy If you are about to stop having your period (premenopausal) and you may become pregnant, seek counseling before you get  pregnant. Take 400 to 800 micrograms (mcg) of folic acid every day if you become pregnant. Ask for birth control (contraception) if you want to prevent pregnancy. Osteoporosis and menopause Osteoporosis is a disease in which the bones lose minerals and strength with aging. This can result in bone fractures. If you are 65 years old or older, or if you are at risk for osteoporosis and fractures, ask your health care provider if you should: Be screened for bone loss. Take a calcium or vitamin D supplement to lower your risk of fractures. Be given hormone replacement therapy (HRT) to treat symptoms of menopause. Follow these instructions at home: Lifestyle Do not use any products that contain nicotine or tobacco, such as cigarettes, e-cigarettes, and chewing tobacco. If you need help quitting, ask your health care provider. Do not use street drugs. Do not share needles. Ask your health care provider for help if you need support or information about quitting drugs. Alcohol use Do not drink alcohol if: Your health care provider tells you not to drink. You are pregnant, may be pregnant, or are planning to become pregnant. If you drink alcohol: Limit how much you use to 0-1 drink a day. Limit intake if you are breastfeeding. Be aware of how much alcohol is in your drink. In the [place] one drink equals one 12 oz bottle of beer (355 mL), one 5 oz glass of wine (148 mL), or one 1 oz glass of hard liquor (44 mL). General instructions Schedule regular health, dental, and eye exams. Stay current with your vaccines. Tell your health care provider if: You often feel depressed. You have ever been abused or do not feel safe at home. Summary Adopting a healthy lifestyle and getting preventive care are important in promoting health and wellness. Follow your health care provider's instructions about healthy diet, exercising, and getting tested or screened for diseases. Follow your health care provider's  instructions on monitoring your cholesterol and blood pressure. This information is not intended to replace advice given to you by your health care provider. Make sure you discuss any questions you have with your healthcare provider. Document Revised: 03/31/2018 Document Reviewed: 03/31/2018 Elsevier Patient Education  2022 [employer]  

## 2020-10-11 ENCOUNTER — Other Ambulatory Visit: Payer: Self-pay

## 2020-10-11 ENCOUNTER — Ambulatory Visit (INDEPENDENT_AMBULATORY_CARE_PROVIDER_SITE_OTHER): Payer: BC Managed Care – PPO

## 2020-10-11 DIAGNOSIS — Z1231 Encounter for screening mammogram for malignant neoplasm of breast: Secondary | ICD-10-CM

## 2020-11-09 ENCOUNTER — Encounter: Payer: Self-pay | Admitting: Podiatry

## 2020-11-09 ENCOUNTER — Other Ambulatory Visit: Payer: Self-pay

## 2020-11-09 ENCOUNTER — Ambulatory Visit (INDEPENDENT_AMBULATORY_CARE_PROVIDER_SITE_OTHER): Payer: BC Managed Care – PPO

## 2020-11-09 ENCOUNTER — Ambulatory Visit: Payer: BC Managed Care – PPO | Admitting: Podiatry

## 2020-11-09 DIAGNOSIS — M2140 Flat foot [pes planus] (acquired), unspecified foot: Secondary | ICD-10-CM

## 2020-11-09 DIAGNOSIS — M76821 Posterior tibial tendinitis, right leg: Secondary | ICD-10-CM | POA: Diagnosis not present

## 2020-11-09 DIAGNOSIS — M2141 Flat foot [pes planus] (acquired), right foot: Secondary | ICD-10-CM | POA: Diagnosis not present

## 2020-11-09 MED ORDER — MELOXICAM 15 MG PO TABS: 15.0000 mg | ORAL_TABLET | Freq: Every day | ORAL | 0 refills | Status: DC | PRN

## 2020-11-09 NOTE — Progress Notes (Signed)
Subjective:   Patient ID: Jaime Michael, female   DOB: 65 y.o.   MRN: [id number]   HPI 65 year old female presents the office today for concerns of left foot discomfort, swelling.  This is been ongoing for a long time, greater than 1 year.  She states initially the pain started in the bottom of the heel but is now on the medial aspect of the foot that she points to.  She notes some swelling.  Has been trying oils which has helped some but still having discomfort and swelling.  She gets pain mostly with walking or when she first gets up in the morning.  No recent injury or falls.  She thinks is coming from her flatfoot.   Review of Systems  All other systems reviewed and are negative.  Past Medical History:  Diagnosis Date   Encounter for preventive health examination 04/25/2016   Palpitations 01/27/2017   Postmenopausal 05/12/2016   Postural dizziness with presyncope 06/18/2018   Vitamin D deficiency 04/28/2016    Past Surgical History:  Procedure Laterality Date   CESAREAN SECTION       Current Outpatient Medications:    meloxicam (MOBIC) 15 MG tablet, Take 1 tablet (15 mg total) by mouth daily as needed for pain., Disp: 30 tablet, Rfl: 0   Calcium Carb-Cholecalciferol 500-400 MG-UNIT CHEW, Chew 1 tablet by mouth 2 (two) times daily. , Disp: , Rfl:    metoprolol tartrate (LOPRESSOR) 25 MG tablet, TAKE 1/2 TABLET BY MOUTH TWICE DAILY IN THE MORNING AND IN THE EVENING, Disp: 90 tablet, Rfl: 1  No Known Allergies        Objective:  Physical Exam  General: AAO x3, NAD  Dermatological: Skin is warm, dry and supple bilateral. . There are no open sores, no preulcerative lesions, no rash or signs of infection present.  Vascular: Dorsalis Pedis artery and Posterior Tibial artery pedal pulses are 2/4 bilateral with immedate capillary fill time.There is no pain with calf compression, swelling, warmth, erythema.   Neruologic: Grossly intact via light touch bilateral.  Musculoskeletal:  Decreased medial arch upon weightbearing right side worse than left.  There is no significant discomfort to palpation today but there is edema but mostly localized in the medial aspect of foot and ankle starting inferior to the medial malleolus towards The navicular tuberosity.  Posterior tibial, flexor tendons appear to be intact.  No area of pinpoint tenderness today.  She is able to do a single and double heel rise however on a single heel rise on the right side it is uncomfortable.  Muscular strength 5/5 in all groups tested bilateral.  Gait: Unassisted, Nonantalgic.       Assessment:   Posterior tibial tendinitis, dysfunction     Plan:  -Treatment options discussed including all alternatives, risks, and complications -Etiology of symptoms were discussed -X-rays obtained and independently reviewed by myself.  No evidence of acute fracture.  Pronation is present. -Although not having pain on palpation she does have swelling.,  Immobilize in a Tri-Lock ankle brace for now.  Prescribe meloxicam discussed side effects.  Icing daily.  As she starts to feel better discussed traction, rehab exercises which provided her a worksheet for today.  We discussed shoe modifications and wearing shoes with better arch supports.  Discussed custom inserts but will start with an over-the-counter insert.

## 2020-11-09 NOTE — Patient Instructions (Addendum)
For instructions on how to put on your Tri-Lock Ankle Brace, please visit [email or web address]   For inserts I like the "powerstep" or "superfeet" inserts.   Posterior Tibial Tendon Tear Rehab Ask your health care provider which exercises are safe for you. Do exercises exactly as told by your health care provider and adjust them as directed. It is normal to feel mild stretching, pulling, tightness, or discomfort as you do these exercises. Stop right away if you feel sudden pain or your pain gets worse. Do not begin these exercises until told by your health care provider. Stretching and range-of-motion exercises These exercises warm up your muscles and joints and improve the movement and flexibility of your ankle. These exercises also help to relieve pain, numbness,and tingling. Gastroc stretch  Sit on the floor with your left / right leg extended. Loop a belt or towel around ball of your left / right foot. The ball of your foot is on the walking surface, right under your toes. Keep your left / right ankle and foot relaxed and keep your knee straight while you use the belt or towel to pull your foot and ankle toward you. You should feel a gentle stretch behind your calf or knee (gastrocnemius). Hold this position for __________ seconds. Repeat __________ times. Complete this exercise __________ times a day. Active ankle dorsiflexion and plantar flexion  Sit with your left / right knee straight or bent. Do not rest your foot on anything. Flex your left / right ankle to tilt the top of your foot toward your shin (dorsiflexion). Hold this position for __________ seconds. Point your toes downward to tilt the top of your foot away from your shin (plantar flexion). Hold this position for __________ seconds. Repeat __________ times with your knee straight and __________ times with yourknee bent. Complete this exercise __________ times a day. Passive ankle plantar flexion  Sit with your left /  right leg crossed over your opposite knee. With your left / right hand, pull the front of your foot and toes toward you (plantar flexion). You should feel a gentle stretch on the top of your foot and ankle. Hold this position for __________ seconds. Repeat __________ times. Complete this exercise __________ times a day. Passive ankle eversion  Sit with your left / right ankle crossed over your opposite knee. With your left / right hand, hold your foot so that your thumb is on the top of your foot and your fingers are on the bottom of your foot. Gently push and twist your ankle downward (eversion) so the smallest toes rise slightly toward the ceiling. You should feel a gentle stretch on the inside of your ankle. Hold this stretch for __________ seconds. Repeat __________ times. Complete this exercise __________ times a day. Passive ankle inversion  Sit with your left / right ankle crossed over your opposite knee. With your left / right hand, hold your foot so that your thumb is on the bottom of your foot and your fingers are across the top of your foot. Gently pull and twist your foot so the smallest toe comes toward you (inversion). You should feel a gentle stretch on the outside of your ankle. Hold the stretch for __________ seconds. Repeat __________ times. Complete this exercise __________ times a day. Ankle alphabet  Sit with your left / right leg supported at the lower leg. Do not rest your foot on anything. Make sure your foot has room to move freely. Think of your left / right foot  as a paintbrush, and move your foot to trace each letter of the alphabet in the air. Keep your hip and knee still while you trace. Trace every letter of the alphabet. Repeat __________ times. Complete this exercise __________ times a day. Strengthening exercises These exercises build strength and endurance in your lower leg. Endurance isthe ability to use your muscles for a long time, even after they get  tired. Dorsiflexion  Secure a rubber exercise band or tube to an object that will not move if it is pulled on, such as a table leg. Secure the other end of the band around your left / right foot. Sit on the floor, facing the object with your left / right leg extended. The band or tube should be slightly tense when your foot is relaxed. Slowly flex your left / right ankle and toes to bring your foot toward you (dorsiflexion). Hold this position for __________ seconds. Let the band or tube slowly pull your foot back to the starting position. Repeat __________ times. Complete this exercise __________ times a day. Plantar flexion while seated  Sit on the floor with your left / right leg extended. Loop a rubber exercise band or tube around the ball of your left / right foot. The ball of your foot is on the walking surface, right under your toes. The band or tube should be slightly tense when your foot is relaxed. Slowly point your toes downward, pushing them away from you (plantar flexion). Hold this position for __________ seconds. Let the band or tube slowly pull your foot back to the starting position. Repeat __________ times. Complete this exercise __________ times a day. Towel curls  Sit in a chair on a non-carpeted surface, and put your feet on the floor. Place a towel in front of your feet. If told by your health care provider, add __________ to the end of the towel. Keeping your heel on the floor, put your left / right foot on the towel. Pull the towel toward you by grabbing the towel with your toes and curling them under. Keep your heel on the floor. Repeat __________ times. Complete this exercise __________ times a day. This information is not intended to replace advice given to you by your health care provider. Make sure you discuss any questions you have with your healthcare provider. Document Revised: 07/30/2018 Document Reviewed: 05/26/2018 Else[employer] Patient Education  [employer].

## 2020-12-06 ENCOUNTER — Other Ambulatory Visit: Payer: Self-pay | Admitting: Podiatry

## 2020-12-14 ENCOUNTER — Other Ambulatory Visit: Payer: Self-pay | Admitting: Cardiology

## 2020-12-28 ENCOUNTER — Ambulatory Visit: Payer: BC Managed Care – PPO | Admitting: Podiatry

## 2020-12-28 ENCOUNTER — Encounter: Payer: Self-pay | Admitting: Podiatry

## 2020-12-28 ENCOUNTER — Other Ambulatory Visit: Payer: Self-pay

## 2020-12-28 DIAGNOSIS — M2140 Flat foot [pes planus] (acquired), unspecified foot: Secondary | ICD-10-CM | POA: Diagnosis not present

## 2020-12-28 DIAGNOSIS — M76821 Posterior tibial tendinitis, right leg: Secondary | ICD-10-CM

## 2020-12-28 NOTE — Patient Instructions (Signed)
Look at getting a "powerstep" or "superfeet" insert for your shoes. You need one for low arches/flatfoot.   WEARING INSTRUCTIONS FOR ORTHOTICS  Don't expect to be comfortable wearing your orthotic devices for the first time.  Like eyeglasses, you may be aware of them as time passes, they will not be uncomfortable and you will enjoy wearing them.  FOLLOW THESE INSTRUCTIONS EXACTLY!  Wear your orthotic devices for:       Not more than 1 hour the first day.       Not more than 2 hours the second day.       Not more than 3 hours the third day and so on.        Or wear them for as long as they feel comfortable.       If you experience discomfort in your feet or legs take them out.  When feet & legs feel       better, put them back in.  You do need to be consistent and wear them a little        everyday. 2.   If at any time the orthotic devices become acutely uncomfortable before the       time for that particular day, STOP WEARING THEM. 3.   On the next day, do not increase the wearing time. 4.   Subsequently, increase the wearing time by 15-30 minutes only if comfortable to do       so. 5.   You will be seen by your doctor about 2-4 weeks after you receive your orthotic       devices, at which time you will probably be wearing your devices comfortably        for about 8 hours or more a day. 6.   Some patients occasionally report mild aches or discomfort in other parts of the of       body such as the knees, hips or back after 3 or 4 consecutive hours of wear.  If this       is the case with you, do not extend your wearing time.  Instead, cut it back an hour or       two.  In all likelihood, these symptoms will disappear in a short period of time as your       body posture realigns itself and functions more efficiently. 7.   It is possible that your orthotic device may require some small changes or adjustment       to improve their function or make them more comfortable.   This is usually  not done       before one to three months have elapsed.  These adjustments are made in        accordance with the changed position your feet are assuming as a result of       improved biomechanical function. 8.   In women's shoes, it's not unusual for your heel to slip out of the shoe, particularly if       they are step-in-shoes.  If this is the case, try other shoes or other styles.  Try to       purchase shoes which have deeper heal seats or higher heel counters. 9.   Squeaking of orthotics devices in the shoes is due to the movement of the devices       when they are functioning normally.  To eliminate squeaking, simply dust some       baby  powder into your shoes before inserting the devices.  If this does not work,        apply soap or wax to the edges of the orthotic devices or put a tissue into the shoes. 10. It is important that you follow these directions explicitly.  Failure to do so will simply       prolong the adjustment period or create problems which are easily avoided.  It makes       no difference if you are wearing your orthotic devices for only a few hours after        several months, so long as you are wearing them comfortably for those hours. 11. If you have any questions or complaints, contact our office.  We have no way of       knowing about your problems unless you tell us.  If we do not hear from you, we will       assume that you are proceeding well.  Plantar Fasciitis (Heel Spur Syndrome) with Rehab The plantar fascia is a fibrous, ligament-like, soft-tissue structure that spans the bottom of the foot. Plantar fasciitis is a condition that causes pain in the foot due to inflammation of the tissue. SYMPTOMS  Pain and tenderness on the underneath side of the foot. Pain that worsens with standing or walking. CAUSES  Plantar fasciitis is caused by irritation and injury to the plantar fascia on the underneath side of the foot. Common mechanisms of injury  include: Direct trauma to bottom of the foot. Damage to a small nerve that runs under the foot where the main fascia attaches to the heel bone. Stress placed on the plantar fascia due to bone spurs. RISK INCREASES WITH:  Activities that place stress on the plantar fascia (running, jumping, pivoting, or cutting). Poor strength and flexibility. Improperly fitted shoes. Tight calf muscles. Flat feet. Failure to warm-up properly before activity. Obesity. PREVENTION Warm up and stretch properly before activity. Allow for adequate recovery between workouts. Maintain physical fitness: Strength, flexibility, and endurance. Cardiovascular fitness. Maintain a health body weight. Avoid stress on the plantar fascia. Wear properly fitted shoes, including arch supports for individuals who have flat feet.  PROGNOSIS  If treated properly, then the symptoms of plantar fasciitis usually resolve without surgery. However, occasionally surgery is necessary.  RELATED COMPLICATIONS  Recurrent symptoms that may result in a chronic condition. Problems of the lower back that are caused by compensating for the injury, such as limping. Pain or weakness of the foot during push-off following surgery. Chronic inflammation, scarring, and partial or complete fascia tear, occurring more often from repeated injections.  TREATMENT  Treatment initially involves the use of ice and medication to help reduce pain and inflammation. The use of strengthening and stretching exercises may help reduce pain with activity, especially stretches of the Achilles tendon. These exercises may be performed at home or with a therapist. Your caregiver may recommend that you use heel cups of arch supports to help reduce stress on the plantar fascia. Occasionally, corticosteroid injections are given to reduce inflammation. If symptoms persist for greater than 6 months despite non-surgical (conservative), then surgery may be recommended.    MEDICATION  If pain medication is necessary, then nonsteroidal anti-inflammatory medications, such as aspirin and ibuprofen, or other minor pain relievers, such as acetaminophen, are often recommended. Do not take pain medication within 7 days before surgery. Prescription pain relievers may be given if deemed necessary by your caregiver. Use only as directed and  only as much as you need. Corticosteroid injections may be given by your caregiver. These injections should be reserved for the most serious cases, because they may only be given a certain number of times.  HEAT AND COLD Cold treatment (icing) relieves pain and reduces inflammation. Cold treatment should be applied for 10 to 15 minutes every 2 to 3 hours for inflammation and pain and immediately after any activity that aggravates your symptoms. Use ice packs or massage the area with a piece of ice (ice massage). Heat treatment may be used prior to performing the stretching and strengthening activities prescribed by your caregiver, physical therapist, or athletic trainer. Use a heat pack or soak the injury in warm water.  SEEK IMMEDIATE MEDICAL CARE IF: Treatment seems to offer no benefit, or the condition worsens. Any medications produce adverse side effects.  EXERCISES- RANGE OF MOTION (ROM) AND STRETCHING EXERCISES - Plantar Fasciitis (Heel Spur Syndrome) These exercises may help you when beginning to rehabilitate your injury. Your symptoms may resolve with or without further involvement from your physician, physical therapist or athletic trainer. While completing these exercises, remember:  Restoring tissue flexibility helps normal motion to return to the joints. This allows healthier, less painful movement and activity. An effective stretch should be held for at least 30 seconds. A stretch should never be painful. You should only feel a gentle lengthening or release in the stretched tissue.  RANGE OF MOTION - Toe Extension,  Flexion Sit with your right / left leg crossed over your opposite knee. Grasp your toes and gently pull them back toward the top of your foot. You should feel a stretch on the bottom of your toes and/or foot. Hold this stretch for 10 seconds. Now, gently pull your toes toward the bottom of your foot. You should feel a stretch on the top of your toes and or foot. Hold this stretch for 10 seconds. Repeat  times. Complete this stretch 3 times per day.   RANGE OF MOTION - Ankle Dorsiflexion, Active Assisted Remove shoes and sit on a chair that is preferably not on a carpeted surface. Place right / left foot under knee. Extend your opposite leg for support. Keeping your heel down, slide your right / left foot back toward the chair until you feel a stretch at your ankle or calf. If you do not feel a stretch, slide your bottom forward to the edge of the chair, while still keeping your heel down. Hold this stretch for 10 seconds. Repeat 3 times. Complete this stretch 2 times per day.   STRETCH  Gastroc, Standing Place hands on wall. Extend right / left leg, keeping the front knee somewhat bent. Slightly point your toes inward on your back foot. Keeping your right / left heel on the floor and your knee straight, shift your weight toward the wall, not allowing your back to arch. You should feel a gentle stretch in the right / left calf. Hold this position for 10 seconds. Repeat 3 times. Complete this stretch 2 times per day.  STRETCH  Soleus, Standing Place hands on wall. Extend right / left leg, keeping the other knee somewhat bent. Slightly point your toes inward on your back foot. Keep your right / left heel on the floor, bend your back knee, and slightly shift your weight over the back leg so that you feel a gentle stretch deep in your back calf. Hold this position for 10 seconds. Repeat 3 times. Complete this stretch 2 times per day.  STRETCH  Gastrocsoleus, Standing  Note: This exercise  can place a lot of stress on your foot and ankle. Please complete this exercise only if specifically instructed by your caregiver.  Place the ball of your right / left foot on a step, keeping your other foot firmly on the same step. Hold on to the wall or a rail for balance. Slowly lift your other foot, allowing your body weight to press your heel down over the edge of the step. You should feel a stretch in your right / left calf. Hold this position for 10 seconds. Repeat this exercise with a slight bend in your right / left knee. Repeat 3 times. Complete this stretch 2 times per day.   STRENGTHENING EXERCISES - Plantar Fasciitis (Heel Spur Syndrome)  These exercises may help you when beginning to rehabilitate your injury. They may resolve your symptoms with or without further involvement from your physician, physical therapist or athletic trainer. While completing these exercises, remember:  Muscles can gain both the endurance and the strength needed for everyday activities through controlled exercises. Complete these exercises as instructed by your physician, physical therapist or athletic trainer. Progress the resistance and repetitions only as guided.  STRENGTH - Towel Curls Sit in a chair positioned on a non-carpeted surface. Place your foot on a towel, keeping your heel on the floor. Pull the towel toward your heel by only curling your toes. Keep your heel on the floor. Repeat 3 times. Complete this exercise 2 times per day.  STRENGTH - Ankle Inversion Secure one end of a rubber exercise band/tubing to a fixed object (table, pole). Loop the other end around your foot just before your toes. Place your fists between your knees. This will focus your strengthening at your ankle. Slowly, pull your big toe up and in, making sure the band/tubing is positioned to resist the entire motion. Hold this position for 10 seconds. Have your muscles resist the band/tubing as it slowly pulls your foot  back to the starting position. Repeat 3 times. Complete this exercises 2 times per day.  Document Released: 04/07/2005 Document Revised: 06/30/2011 Document Reviewed: 07/20/2008 [facility] Patient Information 2014 [place], [place].

## 2021-01-02 NOTE — Progress Notes (Signed)
Subjective: 65 year old female presents the office today for evaluation of left foot, ankle discomfort, swelling.  She states that the brace was helpful when she was wearing it.  The meloxicam has not.  She did not get inserts or change shoes and she still having discomfort.  No recent injury or changes or any changes otherwise.  Objective: AAO x3, NAD DP/PT pulses palpable bilaterally, CRT less than 3 seconds Decreased medial arch upon weightbearing.  There is still localized swelling posterior and inferior to medial malleolus on the course the posterior tibial, flexor tendons.  No significant discomfort today on exam.  No area pinpoint tenderness.  Flexor, extensor tendons appear to be intact.  No pain with ankle or subtalar range of motion.  No pain with calf compression, swelling, warmth, erythema  Assessment: 65 year old female with likely posterior tibial tendon dysfunction  Plan: -All treatment options discussed with the patient including all alternatives, risks, complications.  -At this point we discussed different shoe gear as well as orthotics.  I gave her names of inserts that she can try that she can purchase over-the-counter.  Discussed doing custom inserts as well if needed.  Discussed stretching, rehab exercises to also help.  If symptoms continue despite conservative treatment we will order MRI. -Patient encouraged to call the office with any questions, concerns, change in symptoms.   Trula Slade DPM

## 2021-02-15 ENCOUNTER — Ambulatory Visit: Payer: BC Managed Care – PPO | Admitting: Podiatry

## 2021-03-08 ENCOUNTER — Other Ambulatory Visit: Payer: Self-pay | Admitting: Podiatry

## 2021-03-08 NOTE — Telephone Encounter (Signed)
Please advise 

## 2021-03-13 ENCOUNTER — Other Ambulatory Visit: Payer: Self-pay | Admitting: Cardiology

## 2021-06-11 ENCOUNTER — Other Ambulatory Visit: Payer: Self-pay | Admitting: Cardiology

## 2021-06-20 ENCOUNTER — Encounter: Payer: Self-pay | Admitting: Gastroenterology

## 2021-06-24 ENCOUNTER — Other Ambulatory Visit: Payer: Self-pay | Admitting: Cardiology

## 2021-07-04 ENCOUNTER — Ambulatory Visit: Payer: BC Managed Care – PPO | Admitting: Cardiology

## 2021-07-04 ENCOUNTER — Other Ambulatory Visit: Payer: Self-pay

## 2021-07-04 ENCOUNTER — Encounter: Payer: Self-pay | Admitting: Cardiology

## 2021-07-04 VITALS — BP 124/80 | HR 60 | Ht 59.0 in | Wt 152.0 lb

## 2021-07-04 DIAGNOSIS — Z832 Family history of diseases of the blood and blood-forming organs and certain disorders involving the immune mechanism: Secondary | ICD-10-CM

## 2021-07-04 DIAGNOSIS — E559 Vitamin D deficiency, unspecified: Secondary | ICD-10-CM | POA: Diagnosis not present

## 2021-07-04 DIAGNOSIS — R002 Palpitations: Secondary | ICD-10-CM | POA: Diagnosis not present

## 2021-07-04 DIAGNOSIS — R55 Syncope and collapse: Secondary | ICD-10-CM | POA: Diagnosis not present

## 2021-07-04 DIAGNOSIS — R42 Dizziness and giddiness: Secondary | ICD-10-CM | POA: Diagnosis not present

## 2021-07-04 HISTORY — DX: Family history of diseases of the blood and blood-forming organs and certain disorders involving the immune mechanism: Z83.2

## 2021-07-04 NOTE — Patient Instructions (Signed)

## 2021-07-04 NOTE — Progress Notes (Signed)
?Cardiology Office Note:   ? ?Date:  07/04/2021  ? ?IDVikkie Michael, DOB 02/10/1956, MRN [id number] ? ?PCP:  Howard Pouch A, DO  ?Cardiologist:  Jenean Lindau, MD  ? ?Referring MD: Howard Pouch A, DO  ? ? ?ASSESSMENT:   ? ?1. Postural dizziness with presyncope   ?2. Vitamin D deficiency   ?3. Palpitations   ?4. Family history of protein C deficiency   ? ?PLAN:   ? ?In order of problems listed above: ? ?Primary prevention stressed with the patient.  Importance of compliance with diet and medication stressed and she vocalized understanding. ?She was advised to walk at least half an hour a day 5 days a week and she promises to do so. ?Palpitations: These resolved and she is happy about it. ?Vitamin D deficiency: She has an appointment coming up with primary care and she will get her blood work done and they will guide her for this. ?Her son who is in residency program had a what appears to be a venous thrombosis not clear about it.  I am not sure whether it is arterial or venous and his doctor told him that his parents should be checked for protein C deficiency.  I told her to get in touch with her primary care provider for all these evaluations and she agrees. ?Patient will be seen in follow-up appointment in 12 months or earlier if the patient has any concerns ? ? ? ?Medication Adjustments/Labs and Tests Ordered: ?Current medicines are reviewed at length with the patient today.  Concerns regarding medicines are outlined above.  ?Orders Placed This Encounter  ?Procedures  ? EKG 12-Lead  ? ?No orders of the defined types were placed in this encounter. ? ? ? ?No chief complaint on file. ?  ? ?History of Present Illness:   ? ?Jaime Michael is a 66 y.o. female.  Patient has past medical history of palpitations and obesity.  She denies any problems at this time and takes care of activities of daily living.  No chest pain orthopnea or PND.  She is an active lady.  Her palpitations have resolved.  At the time of my  evaluation, the patient is alert awake oriented and in no distress. ? ?Past Medical History:  ?Diagnosis Date  ? Acquired valgus deformity of right ankle 10/05/2020  ? Encounter for preventive health examination 04/25/2016  ? Palpitations 01/27/2017  ? Pes planus of right foot 10/05/2020  ? Postmenopausal 05/12/2016  ? Postural dizziness with presyncope 06/18/2018  ? Vitamin D deficiency 04/28/2016  ? ? ?Past Surgical History:  ?Procedure Laterality Date  ? CESAREAN SECTION    ? ? ?Current Medications: ?Current Meds  ?Medication Sig  ? Calcium Carb-Cholecalciferol 500-400 MG-UNIT CHEW Chew 1 tablet by mouth 2 (two) times daily.   ? metoprolol tartrate (LOPRESSOR) 25 MG tablet Take 0.5 tablets (12.5 mg total) by mouth 2 (two) times daily. Patient needs an appointment for further refills. 3 rd/ final attempt  ?  ? ?Allergies:   Patient has no known allergies.  ? ?Social History  ? ?Socioeconomic History  ? Marital status: Married  ?  Spouse name: Ahamd  ? Number of children: 1  ? Years of education: PhD  ? Highest education level: Not on file  ?Occupational History  ? Occupation: Pharmacist, hospital  ?Tobacco Use  ? Smoking status: Never  ?  Passive exposure: Never  ? Smokeless tobacco: Never  ?Vaping Use  ? Vaping Use: Never used  ?Substance and Sexual  Activity  ? Alcohol use: No  ? Drug use: No  ? Sexual activity: Never  ?  Partners: Male  ?  Birth control/protection: None  ?  Comment: Married  ?Other Topics Concern  ? Not on file  ?Social History Narrative  ? Married to Doland. One child  ? PHD/professor  ? Drinks caffeine, uses herbal remedies.  ? Wears her seatbelt. Smoke detectors in the home.  ? Feels safe in her relationships.  ? ?Social Determinants of Health  ? ?Financial Resource Strain: Not on file  ?Food Insecurity: Not on file  ?Transportation Needs: Not on file  ?Physical Activity: Not on file  ?Stress: Not on file  ?Social Connections: Not on file  ?  ? ?Family History: ?The patient's family history includes Stomach cancer  in her father. There is no history of Colon cancer. ? ?ROS:   ?Please see the history of present illness.    ?All other systems reviewed and are negative. ? ?EKGs/Labs/Other Studies Reviewed:   ? 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 ? ? ?Recent Labs: ?10/05/2020: ALT 16; BUN 14; Creatinine, Ser 0.98; Hemoglobin 15.1; Platelets 412.0; Potassium 4.7; Sodium 142; TSH 1.21  ?Recent Lipid Panel ?   ?Component Value Date/Time  ? CHOL 179 10/05/2020 0841  ? TRIG 76.0 10/05/2020 0841  ? HDL 77.30 10/05/2020 0841  ? CHOLHDL 2 10/05/2020 0841  ? VLDL 15.2 10/05/2020 0841  ? [place] 86 10/05/2020 0841  ? ? ?Physical Exam:   ? ?VS:  BP 124/80 (BP Location: Left Arm)   Pulse 60   Ht '4\' 11"'$  (1.499 m)   Wt 152 lb (68.9 kg)   SpO2 98%   BMI 30.70 kg/m?    ? ?Wt Readings from Last 3 Encounters:  ?07/04/21 152 lb (68.9 kg)  ?10/05/20 146 lb 3.2 oz (66.3 kg)  ?01/31/20 149 lb 0.2 oz (67.6 kg)  ?  ? ?GEN: Patient is in no acute distress ?HEENT: Normal ?NECK: No JVD; No carotid bruits ?LYMPHATICS: No lymphadenopathy ?CARDIAC: Hear sounds regular, 2/6 systolic murmur at the apex. ?RESPIRATORY:  Clear to auscultation without rales, wheezing or rhonchi  ?ABDOMEN: Soft, non-tender, non-distended ?MUSCULOSKELETAL:  No edema; No deformity  ?SKIN: Warm and dry ?NEUROLOGIC:  Alert and oriented x 3 ?PSYCHIATRIC:  Normal affect  ? ?Signed, ?Jenean Lindau, MD  ?07/04/2021 10:36 AM    ?[place]  ?

## 2021-07-09 ENCOUNTER — Other Ambulatory Visit: Payer: Self-pay

## 2021-07-10 ENCOUNTER — Telehealth: Payer: Self-pay | Admitting: Family Medicine

## 2021-07-10 ENCOUNTER — Encounter: Payer: Self-pay | Admitting: Family Medicine

## 2021-07-10 ENCOUNTER — Ambulatory Visit: Payer: BC Managed Care – PPO | Admitting: Family Medicine

## 2021-07-10 VITALS — BP 118/74 | HR 61 | Temp 98.4°F | Ht 59.0 in | Wt 153.0 lb

## 2021-07-10 DIAGNOSIS — Z8489 Family history of other specified conditions: Secondary | ICD-10-CM

## 2021-07-10 DIAGNOSIS — Z832 Family history of diseases of the blood and blood-forming organs and certain disorders involving the immune mechanism: Secondary | ICD-10-CM | POA: Diagnosis not present

## 2021-07-10 DIAGNOSIS — E559 Vitamin D deficiency, unspecified: Secondary | ICD-10-CM | POA: Diagnosis not present

## 2021-07-10 LAB — VITAMIN D 25 HYDROXY (VIT D DEFICIENCY, FRACTURES): VITD: 46.27 ng/mL (ref 30.00–100.00)

## 2021-07-10 NOTE — Progress Notes (Signed)
? ? ? ?This visit occurred during the SARS-CoV-2 public health emergency.  Safety protocols were in place, including screening questions prior to the visit, additional usage of staff PPE, and extensive cleaning of exam room while observing appropriate contact time as indicated for disinfecting solutions.  ? ? ?Jaime Michael , 07/29/1955, 66 y.o., female ?MRN: [id number] ?Patient Care Team  ?  Relationship Specialty Notifications Start End  ?Ma Hillock, DO PCP - General Family Medicine  04/25/16   ?Revankar, Reita Cliche, MD Consulting Physician Cardiology  05/14/18   ? ? ?Chief Complaint  ?Patient presents with  ? dvt   ?  Pt son has a blood clot near liver and want labs for protein c   ? ?  ?Subjective: Pt presents for an OV to discuss family history of blood clotting disorder.  She reports there is multiple family members on her mother side that have had blood clots.  They live in a different country and have not been tested for disorder.  Her son has had a blood clot near his liver recently.  He was tested and was told he had a protein C defect and is now placed on a blood thinner.  Patient has never had a history of blood clots.  She does endorse 4 D&Cs needed during her lifetime.  She had C-sections with the birth of her children.  She would like to be tested for protein C clotting disorder today.  She reports she has other children and would want to get them tested if she is positive.  If she is positive she would like referral to hematology.   ? ? ?  07/10/2021  ?  9:35 AM 10/05/2020  ?  8:09 AM 05/14/2018  ?  9:04 AM 01/26/2017  ?  2:41 PM 04/25/2016  ?  9:22 AM  ?Depression screen PHQ 2/9  ?Decreased Interest 0 0 0 0 0  ?Down, Depressed, Hopeless 0 0 0 0 0  ?PHQ - 2 Score 0 0 0 0 0  ? ? ?No Known Allergies ?Social History  ? ?Social History Narrative  ? Married to [employer]. One child  ? PHD/professor  ? Drinks caffeine, uses herbal remedies.  ? Wears her seatbelt. Smoke detectors in the home.  ? Feels safe in her  relationships.  ? ?Past Medical History:  ?Diagnosis Date  ? Acquired valgus deformity of right ankle 10/05/2020  ? Encounter for preventive health examination 04/25/2016  ? Palpitations 01/27/2017  ? Pes planus of right foot 10/05/2020  ? Postmenopausal 05/12/2016  ? Postural dizziness with presyncope 06/18/2018  ? Vitamin D deficiency 04/28/2016  ? ?Past Surgical History:  ?Procedure Laterality Date  ? CESAREAN SECTION    ? ?Family History  ?Problem Relation Age of Onset  ? Stomach cancer Father   ? Clotting disorder Brother   ? Clotting disorder Son   ? Clotting disorder Cousin   ?     maternal side  ? Clotting disorder Nephew   ?     maternal side  ? Colon cancer Neg Hx   ? ?Allergies as of 07/10/2021   ?No Known Allergies ?  ? ?  ?Medication List  ?  ? ?  ? Accurate as of July 10, 2021 12:11 PM. If you have any questions, ask your nurse or doctor.  ?  ?  ? ?  ? ?Calcium Carb-Cholecalciferol 500-400 MG-UNIT Chew ?Chew 1 tablet by mouth 2 (two) times daily. ?  ?metoprolol tartrate 25 MG tablet ?Commonly  known as: LOPRESSOR ?Take 0.5 tablets (12.5 mg total) by mouth 2 (two) times daily. Patient needs an appointment for further refills. 3 rd/ final attempt ?  ?Vitamin D-3 5000 UNIT/ML Liqd ?Place under the tongue. ?  ? ?  ? ? ?All past medical history, surgical history, allergies, family history, immunizations andmedications were updated in the EMR today and reviewed under the history and medication portions of their EMR.    ? ?ROS ?Negative, with the exception of above mentioned in HPI ? ? ?Objective:  ?BP 118/74   Pulse 61   Temp 98.4 ?F (36.9 ?C) (Oral)   Ht '4\' 11"'$  (1.499 m)   Wt 153 lb (69.4 kg)   SpO2 99%   BMI 30.90 kg/m?  ?Body mass index is 30.9 kg/m?Marland Kitchen ?Physical Exam ?Vitals and nursing note reviewed.  ?Constitutional:   ?   General: She is not in acute distress. ?   Appearance: Normal appearance. She is not ill-appearing, toxic-appearing or diaphoretic.  ?HENT:  ?   Head: Normocephalic and atraumatic.  ?Eyes:   ?   General: No scleral icterus.    ?   Right eye: No discharge.     ?   Left eye: No discharge.  ?   Extraocular Movements: Extraocular movements intact.  ?   Conjunctiva/sclera: Conjunctivae normal.  ?   Pupils: Pupils are equal, round, and reactive to light.  ?Cardiovascular:  ?   Rate and Rhythm: Normal rate and regular rhythm.  ?   Heart sounds: No murmur heard. ?Pulmonary:  ?   Effort: Pulmonary effort is normal. No respiratory distress.  ?   Breath sounds: Normal breath sounds. No wheezing, rhonchi or rales.  ?Musculoskeletal:  ?   Cervical back: Neck supple. No tenderness.  ?   Right lower leg: No edema.  ?   Left lower leg: No edema.  ?Lymphadenopathy:  ?   Cervical: No cervical adenopathy.  ?Skin: ?   General: Skin is warm and dry.  ?   Coloration: Skin is not jaundiced or pale.  ?   Findings: No erythema or rash.  ?Neurological:  ?   Mental Status: She is alert and oriented to person, place, and time. Mental status is at baseline.  ?   Motor: No weakness.  ?   Gait: Gait normal.  ?Psychiatric:     ?   Mood and Affect: Mood normal.     ?   Behavior: Behavior normal.     ?   Thought Content: Thought content normal.     ?   Judgment: Judgment normal.  ? ? ? ?No results found. ?No results found. ?No results found for this or any previous visit (from the past 24 hour(s)). ? ?Assessment/Plan: ?Jaime Michael is a 66 y.o. female present for OV for  ?Family history of protein C deficiency/Family history of genetic disorder ?Protein C&S panel collected today.  If positive would send to hematology for counseling. ?- Protein C activity ?- Protein C, total ?- Protein S activity ?- Protein S, total ? ? ?Vitamin D deficiency ?Currently patient is taking a high dose of vitamin D 5000 units every other day.  Her levels were low in December and she was encouraged to start 1000/2000 units of vitamin D daily. ?- Vitamin D (25 hydroxy) ? ?Reviewed expectations re: course of current medical issues. ?Discussed self-management  of symptoms. ?Outlined signs and symptoms indicating need for more acute intervention. ?Patient verbalized understanding and all questions were answered. ?Patient received an  After-Visit Summary. ? ? ? ?Orders Placed This Encounter  ?Procedures  ? Protein C activity  ? Protein C, total  ? Protein S activity  ? Protein S, total  ? Vitamin D (25 hydroxy)  ? ?No orders of the defined types were placed in this encounter. ? ?Referral Orders  ?No referral(s) requested today  ? ? ? ?Note is dictated utilizing voice recognition software. Although note has been proof read prior to signing, occasional typographical errors still can be missed. If any questions arise, please do not hesitate to call for verification.  ? ?electronically signed by: ? ?Howard Pouch, DO  ?[facility] [place] ? ? ? ?

## 2021-07-10 NOTE — Telephone Encounter (Signed)
Vitamin D levels are normal.  Can continue vitamin D as she is taking, which she is averaging about 20,000 units a week. ? ?We will call her with the specialty labs once we receive them.  These can take quite a few days to return. ?

## 2021-07-11 ENCOUNTER — Other Ambulatory Visit: Payer: Self-pay

## 2021-07-11 MED ORDER — METOPROLOL TARTRATE 25 MG PO TABS: 12.5000 mg | ORAL_TABLET | Freq: Two times a day (BID) | ORAL | 3 refills | Status: DC

## 2021-07-12 NOTE — Telephone Encounter (Signed)
Spoke with pt regarding labs and instructions.   

## 2021-07-16 LAB — PROTEIN C, TOTAL: Protein C Antigen: 36 % normal — ABNORMAL LOW (ref 70–140)

## 2021-07-16 LAB — PROTEIN S, TOTAL: PROTEIN S ANTIGEN, TOTAL: 91 % normal (ref 70–140)

## 2021-07-16 LAB — PROTEIN S ACTIVITY: Protein S Activity: 109 % normal (ref 60–140)

## 2021-07-16 LAB — PROTEIN C ACTIVITY: Protein C Activity: 30 % normal — ABNORMAL LOW (ref 70–180)

## 2021-07-17 ENCOUNTER — Telehealth: Payer: Self-pay | Admitting: Family Medicine

## 2021-07-17 DIAGNOSIS — E46 Unspecified protein-calorie malnutrition: Secondary | ICD-10-CM

## 2021-07-17 HISTORY — DX: Unspecified protein-calorie malnutrition: E46

## 2021-07-17 NOTE — Telephone Encounter (Signed)
Please call patient ?Her labs indicate protein C deficiency. I have placed a referal to hematology for her. Thanks.  ?

## 2021-07-17 NOTE — Telephone Encounter (Signed)
Spoke with pt regarding labs and instructions.   

## 2021-07-18 ENCOUNTER — Telehealth: Payer: Self-pay | Admitting: Hematology and Oncology

## 2021-07-18 NOTE — Telephone Encounter (Signed)
Scheduled appt per 3/29 referral. Pt is aware of appt date and time. Pt is aware to arrive 15 mins prior to appt time and to bring and updated insurance card. Pt is aware of appt location.   ?

## 2021-07-20 ENCOUNTER — Other Ambulatory Visit: Payer: Self-pay | Admitting: Cardiology

## 2021-08-12 ENCOUNTER — Inpatient Hospital Stay: Payer: BC Managed Care – PPO | Attending: Hematology and Oncology | Admitting: Hematology and Oncology

## 2021-08-12 ENCOUNTER — Inpatient Hospital Stay: Payer: BC Managed Care – PPO

## 2021-08-12 ENCOUNTER — Other Ambulatory Visit: Payer: Self-pay

## 2021-08-12 VITALS — BP 135/79 | HR 72 | Temp 97.8°F | Resp 15 | Ht 59.0 in | Wt 148.5 lb

## 2021-08-12 DIAGNOSIS — D6859 Other primary thrombophilia: Secondary | ICD-10-CM | POA: Diagnosis present

## 2021-08-12 DIAGNOSIS — E559 Vitamin D deficiency, unspecified: Secondary | ICD-10-CM | POA: Diagnosis not present

## 2021-08-12 DIAGNOSIS — Z800 Family history of malignant neoplasm of digestive organs: Secondary | ICD-10-CM | POA: Insufficient documentation

## 2021-08-12 NOTE — Progress Notes (Signed)
?[facility] ?Telephone:([phone]) [phone]   [phone] ? ?INITIAL CONSULT NOTE ? ?Patient Care Team: ?Ma Hillock, DO as PCP - General (Family Medicine) ?Revankar, Reita Cliche, MD as Consulting Physician (Cardiology) ? ?Hematological/Oncological History ?# Decreased Levels of Protein C  ?07/10/2021: Protein C antigen 36%, protein C activity 30% ?08/12/2021: establish care with Dr. Lorenso Courier  ? ?CHIEF COMPLAINTS/PURPOSE OF CONSULTATION:  ?"Decreased Levels of Protein C  " ? ?HISTORY OF PRESENTING ILLNESS:  ?Jaime Michael 66 y.o. female with medical history significant for vitamin D deficiency and pes planus of the right foot who presents for evaluation of decreased levels of protein C. ? ?On review of the previous records Jaime Michael was recently seen by her primary care provider on 07/10/2021.  At that time she noted that her son had recently had a abdominal clot and was found to have protein C deficiency.  He was instructed to have his parents checked for the disorder.  She was checked and have a protein C antigen of 36% with protein C activity of 30%.  Due to concern for these findings she was referred to hematology for further evaluation and management. ? ?On exam today Jaime Michael notes that she has no personal history of VTE.  She reports that she does have a strong family history of blood clots with her brother passing away from a blood clot at age 65 her nephew and cousin having blood clots.  Of note these were all female.  She notes that she has had 3 children and does not have any personal complications during their delivery.  She notes that she has never been on blood thinner and has never required any estrogen supplementation such as for HRT or birth control.  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  She notes that she does not have any issues with headache, chest pain, or shortness of breath.  Of note during her last 2 flights to [place] she got dizzy and  lightheadedness and did faint.  These were thought to be secondary to dehydration.  She otherwise denies any fevers, chills, sweats, nausea, vomiting or diarrhea.  Full 10 point ROS is listed below. ? ?MEDICAL HISTORY:  ?Past Medical History:  ?Diagnosis Date  ? Acquired valgus deformity of right ankle 10/05/2020  ? Encounter for preventive health examination 04/25/2016  ? Palpitations 01/27/2017  ? Pes planus of right foot 10/05/2020  ? Postmenopausal 05/12/2016  ? Postural dizziness with presyncope 06/18/2018  ? Vitamin D deficiency 04/28/2016  ? ? ?SURGICAL HISTORY: ?Past Surgical History:  ?Procedure Laterality Date  ? CESAREAN SECTION    ? DILATION AND CURETTAGE OF UTERUS    ? X4  ? ? ?SOCIAL HISTORY: ?Social History  ? ?Socioeconomic History  ? Marital status: Married  ?  Spouse name: Ahamd  ? Number of children: 1  ? Years of education: PhD  ? Highest education level: Not on file  ?Occupational History  ? Occupation: Pharmacist, hospital  ?Tobacco Use  ? Smoking status: Never  ?  Passive exposure: Never  ? Smokeless tobacco: Never  ?Vaping Use  ? Vaping Use: Never used  ?Substance and Sexual Activity  ? Alcohol use: No  ? Drug use: No  ? Sexual activity: Never  ?  Partners: Male  ?  Birth control/protection: None  ?  Comment: Married  ?Other Topics Concern  ? Not on file  ?Social History Narrative  ? Married to [name]. One child  ? PHD/professor  ?  Drinks caffeine, uses herbal remedies.  ? Wears her seatbelt. Smoke detectors in the home.  ? Feels safe in her relationships.  ? ?Social Determinants of Health  ? ?Financial Resource Strain: Not on file  ?Food Insecurity: Not on file  ?Transportation Needs: Not on file  ?Physical Activity: Not on file  ?Stress: Not on file  ?Social Connections: Not on file  ?Intimate Partner Violence: Not on file  ? ? ?FAMILY HISTORY: ?Family History  ?Problem Relation Age of Onset  ? Stomach cancer Father   ? Clotting disorder Brother   ? Clotting disorder Son   ? Clotting disorder Cousin   ?      maternal side  ? Clotting disorder Nephew   ?     maternal side  ? Colon cancer Neg Hx   ? ? ?ALLERGIES:  has No Known Allergies. ? ?MEDICATIONS:  ?Current Outpatient Medications  ?Medication Sig Dispense Refill  ? Calcium Carb-Cholecalciferol 500-400 MG-UNIT CHEW Chew 1 tablet by mouth 2 (two) times daily.     ? Cholecalciferol (VITAMIN D-3) 5000 UNIT/ML LIQD Place under the tongue.    ? metoprolol tartrate (LOPRESSOR) 25 MG tablet Take 0.5 tablets (12.5 mg total) by mouth 2 (two) times daily. 90 tablet 3  ? ?No current facility-administered medications for this visit.  ? ? ?REVIEW OF SYSTEMS:   ?Constitutional: ( - ) fevers, ( - )  chills , ( - ) night sweats ?Eyes: ( - ) blurriness of vision, ( - ) double vision, ( - ) watery eyes ?Ears, nose, mouth, throat, and face: ( - ) mucositis, ( - ) sore throat ?Respiratory: ( - ) cough, ( - ) dyspnea, ( - ) wheezes ?Cardiovascular: ( - ) palpitation, ( - ) chest discomfort, ( - ) lower extremity swelling ?Gastrointestinal:  ( - ) nausea, ( - ) heartburn, ( - ) change in bowel habits ?Skin: ( - ) abnormal skin rashes ?Lymphatics: ( - ) new lymphadenopathy, ( - ) easy bruising ?Neurological: ( - ) numbness, ( - ) tingling, ( - ) new weaknesses ?Behavioral/Psych: ( - ) mood change, ( - ) new changes  ?All other systems were reviewed with the patient and are negative. ? ?PHYSICAL EXAMINATION: ? ?Vitals:  ? 08/12/21 1335  ?BP: 135/79  ?Pulse: 72  ?Resp: 15  ?Temp: 97.8 ?F (36.6 ?C)  ?SpO2: 99%  ? ?Filed Weights  ? 08/12/21 1335  ?Weight: 148 lb 8 oz (67.4 kg)  ? ? ?GENERAL: well appearing middle-aged Middle [place] female in NAD  ?SKIN: skin color, texture, turgor are normal, no rashes or significant lesions ?EYES: conjunctiva are pink and non-injected, sclera clear ?LUNGS: clear to auscultation and percussion with normal breathing effort ?HEART: regular rate & rhythm and no murmurs and no lower extremity edema ?Musculoskeletal: no cyanosis of digits and no clubbing  ?PSYCH:  alert & oriented x 3, fluent speech ?NEURO: no focal motor/sensory deficits ? ?LABORATORY DATA:  ?I have reviewed the data as listed ? ?  Latest Ref Rng & Units 10/05/2020  ?  8:41 AM 06/07/2018  ?  8:57 AM 05/14/2018  ?  9:28 AM  ?CBC  ?WBC 4.0 - 10.5 K/uL 6.1   6.2   6.2    ?Hemoglobin 12.0 - 15.0 g/dL 15.1   15.2   15.6    ?Hematocrit 36.0 - 46.0 % 45.7   45.7   47.5    ?Platelets 150.0 - 400.0 K/uL 412.0   338.0   362.0    ? ? ? ?  Latest Ref Rng & Units 10/05/2020  ?  8:41 AM 05/14/2018  ?  9:28 AM 01/26/2017  ?  3:18 PM  ?CMP  ?Glucose 70 - 99 mg/dL 97   95   90    ?BUN 6 - 23 mg/dL '14   12   12    '$ ?Creatinine 0.40 - 1.20 mg/dL 0.98   0.95   0.82    ?Sodium 135 - 145 mEq/L 142   144   142    ?Potassium 3.5 - 5.1 mEq/L 4.7   4.7   4.4    ?Chloride 96 - 112 mEq/L 106   107   105    ?CO2 19 - 32 mEq/L '30   31   29    '$ ?Calcium 8.4 - 10.5 mg/dL 9.5   10.0   9.3    ?Total Protein 6.0 - 8.3 g/dL 6.7   7.1   6.5    ?Total Bilirubin 0.2 - 1.2 mg/dL 0.6   0.7   0.3    ?Alkaline Phos 39 - 117 U/L 49   48     ?AST 0 - 37 U/L '15   17   17    '$ ?ALT 0 - 35 U/L '16   14   16    '$ ? ? ? ?ASSESSMENT & PLAN ?Jaime Michael 66 y.o. female with medical history significant for vitamin D deficiency and pes planus of the right foot who presents for evaluation of decreased levels of protein C. ? ?After review of the labs, review of the records, and discussion with the patient the patients findings are most consistent with low levels of protein C with no history of VTE ? ?#Low Protein C Levels in Absence of VTE ?-- There is no indication for anticoagulation in patients with history of VTE with low levels of protein C ?--Discussed with patient that she could consider daily aspirin 81 mg p.o. daily as thromboprophylaxis.  This is not based on guidelines but may provide some protection against VTE.  She declined ?--Explained to patient the risks of protein C deficiency and asymptomatic individual.  Noted that he rates a VTE with 2.5 %/year ?--No need  for return to clinic unless she were to have any new symptoms concerning for VTE. ? ?No orders of the defined types were placed in this encounter. ? ? ?All questions were answered. The patient knows to call the clin

## 2021-09-05 ENCOUNTER — Other Ambulatory Visit: Payer: Self-pay | Admitting: Family Medicine

## 2021-09-05 DIAGNOSIS — Z1231 Encounter for screening mammogram for malignant neoplasm of breast: Secondary | ICD-10-CM

## 2021-10-07 ENCOUNTER — Encounter: Payer: BC Managed Care – PPO | Admitting: Family Medicine

## 2021-10-09 ENCOUNTER — Encounter: Payer: Self-pay | Admitting: Family Medicine

## 2021-10-09 ENCOUNTER — Ambulatory Visit (INDEPENDENT_AMBULATORY_CARE_PROVIDER_SITE_OTHER): Payer: BC Managed Care – PPO | Admitting: Family Medicine

## 2021-10-09 VITALS — BP 114/68 | HR 59 | Temp 97.6°F | Ht 60.0 in | Wt 149.0 lb

## 2021-10-09 DIAGNOSIS — Z0000 Encounter for general adult medical examination without abnormal findings: Secondary | ICD-10-CM | POA: Diagnosis not present

## 2021-10-09 DIAGNOSIS — R002 Palpitations: Secondary | ICD-10-CM | POA: Diagnosis not present

## 2021-10-09 DIAGNOSIS — E559 Vitamin D deficiency, unspecified: Secondary | ICD-10-CM

## 2021-10-09 DIAGNOSIS — Z79899 Other long term (current) drug therapy: Secondary | ICD-10-CM | POA: Diagnosis not present

## 2021-10-09 DIAGNOSIS — Z23 Encounter for immunization: Secondary | ICD-10-CM | POA: Diagnosis not present

## 2021-10-09 DIAGNOSIS — E46 Unspecified protein-calorie malnutrition: Secondary | ICD-10-CM

## 2021-10-09 LAB — LIPID PANEL
Cholesterol: 196 mg/dL (ref 0–200)
HDL: 72.5 mg/dL (ref >39.00)
LDL Cholesterol: 102 mg/dL — ABNORMAL HIGH (ref 0–99)
NonHDL: 123.16
Total CHOL/HDL Ratio: 3
Triglycerides: 107 mg/dL (ref 0.0–149.0)
VLDL: 21.4 mg/dL (ref 0.0–40.0)

## 2021-10-09 LAB — CBC
HCT: 46.6 % — ABNORMAL HIGH (ref 36.0–46.0)
Hemoglobin: 15.2 g/dL — ABNORMAL HIGH (ref 12.0–15.0)
MCHC: 32.7 g/dL (ref 30.0–36.0)
MCV: 92.1 fl (ref 78.0–100.0)
Platelets: 434 10*3/uL — ABNORMAL HIGH (ref 150.0–400.0)
RBC: 5.06 Mil/uL (ref 3.87–5.11)
RDW: 14.9 % (ref 11.5–15.5)
WBC: 6.3 10*3/uL (ref 4.0–10.5)

## 2021-10-09 LAB — COMPREHENSIVE METABOLIC PANEL
ALT: 14 U/L (ref 0–35)
AST: 15 U/L (ref 0–37)
Albumin: 4 g/dL (ref 3.5–5.2)
Alkaline Phosphatase: 39 U/L (ref 39–117)
BUN: 15 mg/dL (ref 6–23)
CO2: 29 mEq/L (ref 19–32)
Calcium: 9.7 mg/dL (ref 8.4–10.5)
Chloride: 105 mEq/L (ref 96–112)
Creatinine, Ser: 0.98 mg/dL (ref 0.40–1.20)
GFR: 60.5 mL/min (ref >60.00)
Glucose, Bld: 91 mg/dL (ref 70–99)
Potassium: 4.4 mEq/L (ref 3.5–5.1)
Sodium: 141 mEq/L (ref 135–145)
Total Bilirubin: 0.6 mg/dL (ref 0.2–1.2)
Total Protein: 6.8 g/dL (ref 6.0–8.3)

## 2021-10-09 LAB — VITAMIN D 25 HYDROXY (VIT D DEFICIENCY, FRACTURES): VITD: 51.84 ng/mL (ref 30.00–100.00)

## 2021-10-09 LAB — HEMOGLOBIN A1C: Hgb A1c MFr Bld: 5.7 % (ref 4.6–6.5)

## 2021-10-09 LAB — TSH: TSH: 1.21 u[IU]/mL (ref 0.35–5.50)

## 2021-10-09 NOTE — Patient Instructions (Signed)
Return in about 1 year (around 10/11/2022) for cpe (20 min).        Great to see you today.  I have refilled the medication(s) we provide.   Please call your GI doctor to schedule your colonoscopy- it was due March 2023.  [facility] Phone: [phone]  If labs were collected, we will inform you of lab results once received either by echart message or telephone call.   - echart message- for normal results that have been seen by the patient already.   - telephone call: abnormal results or if patient has not viewed results in their echart.   Health Maintenance, Female Adopting a healthy lifestyle and getting preventive care are important in promoting health and wellness. Ask your health care provider about: The right schedule for you to have regular tests and exams. Things you can do on your own to prevent diseases and keep yourself healthy. What should I know about diet, weight, and exercise? Eat a healthy diet  Eat a diet that includes plenty of vegetables, fruits, low-fat dairy products, and lean protein. Do not eat a lot of foods that are high in solid fats, added sugars, or sodium. Maintain a healthy weight Body mass index (BMI) is used to identify weight problems. It estimates body fat based on height and weight. Your health care provider can help determine your BMI and help you achieve or maintain a healthy weight. Get regular exercise Get regular exercise. This is one of the most important things you can do for your health. Most adults should: Exercise for at least 150 minutes each week. The exercise should increase your heart rate and make you sweat (moderate-intensity exercise). Do strengthening exercises at least twice a week. This is in addition to the moderate-intensity exercise. Spend less time sitting. Even light physical activity can be beneficial. Watch cholesterol and blood lipids Have your blood tested for lipids and cholesterol at 66  years of age, then have this test every 5 years. Have your cholesterol levels checked more often if: Your lipid or cholesterol levels are high. You are older than 66 years of age. You are at high risk for heart disease. What should I know about cancer screening? Depending on your health history and family history, you may need to have cancer screening at various ages. This may include screening for: Breast cancer. Cervical cancer. Colorectal cancer. Skin cancer. Lung cancer. What should I know about heart disease, diabetes, and high blood pressure? Blood pressure and heart disease High blood pressure causes heart disease and increases the risk of stroke. This is more likely to develop in people who have high blood pressure readings or are overweight. Have your blood pressure checked: Every 3-5 years if you are 61-12 years of age. Every year if you are 66 years old or older. Diabetes Have regular diabetes screenings. This checks your fasting blood sugar level. Have the screening done: Once every three years after age 16 if you are at a normal weight and have a low risk for diabetes. More often and at a younger age if you are overweight or have a high risk for diabetes. What should I know about preventing infection? Hepatitis B If you have a higher risk for hepatitis B, you should be screened for this virus. Talk with your health care provider to find out if you are at risk for hepatitis B infection. Hepatitis C Testing is recommended for: Everyone born from [birth year] through 1965. Anyone with known risk factors for  hepatitis C. Sexually transmitted infections (STIs) Get screened for STIs, including gonorrhea and chlamydia, if: You are sexually active and are younger than 66 years of age. You are older than 66 years of age and your health care provider tells you that you are at risk for this type of infection. Your sexual activity has changed since you were last screened, and you are at  increased risk for chlamydia or gonorrhea. Ask your health care provider if you are at risk. Ask your health care provider about whether you are at high risk for HIV. Your health care provider may recommend a prescription medicine to help prevent HIV infection. If you choose to take medicine to prevent HIV, you should first get tested for HIV. You should then be tested every 3 months for as long as you are taking the medicine. Pregnancy If you are about to stop having your period (premenopausal) and you may become pregnant, seek counseling before you get pregnant. Take 400 to 800 micrograms (mcg) of folic acid every day if you become pregnant. Ask for birth control (contraception) if you want to prevent pregnancy. Osteoporosis and menopause Osteoporosis is a disease in which the bones lose minerals and strength with aging. This can result in bone fractures. If you are 29 years old or older, or if you are at risk for osteoporosis and fractures, ask your health care provider if you should: Be screened for bone loss. Take a calcium or vitamin D supplement to lower your risk of fractures. Be given hormone replacement therapy (HRT) to treat symptoms of menopause. Follow these instructions at home: Alcohol use Do not drink alcohol if: Your health care provider tells you not to drink. You are pregnant, may be pregnant, or are planning to become pregnant. If you drink alcohol: Limit how much you have to: 0-1 drink a day. Know how much alcohol is in your drink. In the [place] one drink equals one 12 oz bottle of beer (355 mL), one 5 oz glass of wine (148 mL), or one 1 oz glass of hard liquor (44 mL). Lifestyle Do not use any products that contain nicotine or tobacco. These products include cigarettes, chewing tobacco, and vaping devices, such as e-cigarettes. If you need help quitting, ask your health care provider. Do not use street drugs. Do not share needles. Ask your health care provider for help  if you need support or information about quitting drugs. General instructions Schedule regular health, dental, and eye exams. Stay current with your vaccines. Tell your health care provider if: You often feel depressed. You have ever been abused or do not feel safe at home. Summary Adopting a healthy lifestyle and getting preventive care are important in promoting health and wellness. Follow your health care provider's instructions about healthy diet, exercising, and getting tested or screened for diseases. Follow your health care provider's instructions on monitoring your cholesterol and blood pressure. This information is not intended to replace advice given to you by your health care provider. Make sure you discuss any questions you have with your health care provider. Document Revised: 08/27/2020 Document Reviewed: 08/27/2020 Elsevier Patient Education  [place].

## 2021-10-09 NOTE — Progress Notes (Signed)
Patient ID: Jaime Michael, female  DOB: Feb 26, 1956, 66 y.o.   MRN: [id number] Patient Care Team    Relationship Specialty Notifications Start End  Ma Hillock, DO PCP - General Family Medicine  04/25/16   Revankar, Reita Cliche, MD Consulting Physician Cardiology  05/14/18     Chief Complaint  Patient presents with   Annual Exam    Pt is fasting    Subjective: Jaime Michael is a 66 y.o.  Female  present for CPE All past medical history, surgical history, allergies, family history, immunizations, medications and social history were updateed in the electronic medical record today. All recent labs, ED visits and hospitalizations within the last year were reviewed.  Health maintenance:  Colonoscopy: No family history, completed 06/25/2016. Ardis Hughs, polyp, 5 year.  Mammogram: completed 09/2020; No family history. [facility]> has scheduled 09/20/2021 Immunizations: tdap UTD 2018. Declined flu shot. Shingrix completed, covid x3, PNA20  Infectious disease screening: HIV and Hep C completed  DEXA: 2018- normal Assistive device: reviewed Oxygen KWI:OXBDZH Patient has a Dental home. Hospitalizations/ED visits: reviewed      10/09/2021    9:45 AM 07/10/2021    9:35 AM 10/05/2020    8:09 AM 05/14/2018    9:04 AM 01/26/2017    2:41 PM  Depression screen PHQ 2/9  Decreased Interest 0 0 0 0 0  Down, Depressed, Hopeless 0 0 0 0 0  PHQ - 2 Score 0 0 0 0 0      10/05/2020    8:09 AM  GAD 7 : Generalized Anxiety Score  Nervous, Anxious, on Edge 0  Control/stop worrying 1  Worry too much - different things 1  Trouble relaxing 0  Restless 0  Easily annoyed or irritable 0  Afraid - awful might happen 0  Total GAD 7 Score 2    Immunization History  Administered Date(s) Administered   PFIZER(Purple Top)SARS-COV-2 Vaccination 07/01/2019, 07/22/2019, 04/23/2020   PNEUMOCOCCAL CONJUGATE-20 10/09/2021   Tdap 04/25/2016   Zoster Recombinat (Shingrix) 06/21/2020, 08/29/2020   Past Medical History:   Diagnosis Date   Acquired valgus deformity of right ankle 10/05/2020   Encounter for preventive health examination 04/25/2016   Palpitations 01/27/2017   Pes planus of right foot 10/05/2020   Postmenopausal 05/12/2016   Postural dizziness with presyncope 06/18/2018   Vitamin D deficiency 04/28/2016   No Known Allergies Past Surgical History:  Procedure Laterality Date   CESAREAN SECTION     DILATION AND CURETTAGE OF UTERUS     X4   Family History  Problem Relation Age of Onset   Stomach cancer Father    Clotting disorder Brother    Clotting disorder Son    Clotting disorder Cousin        maternal side   Clotting disorder Nephew        maternal side   Colon cancer Neg Hx    Social History   Social History Narrative   Married to St[place]. One child   PHD/professor   Drinks caffeine, uses herbal remedies.   Wears her seatbelt. Smoke detectors in the home.   Feels safe in her relationships.    Allergies as of 10/09/2021   No Known Allergies      Medication List        Accurate as of October 09, 2021 10:33 AM. If you have any questions, ask your nurse or doctor.          Calcium Carb-Cholecalciferol 500-400 MG-UNIT Chew Chew 1 tablet by mouth  2 (two) times daily.   metoprolol tartrate 25 MG tablet Commonly known as: LOPRESSOR Take 0.5 tablets (12.5 mg total) by mouth 2 (two) times daily.   Vitamin D-3 5000 UNIT/ML Liqd Place under the tongue.        All past medical history, surgical history, allergies, family history, immunizations andmedications were updated in the EMR today and reviewed under the history and medication portions of their EMR.     No results found for this or any previous visit (from the past 2160 hour(s)).   ROS 14 pt review of systems performed and negative (unless mentioned in an HPI) Objective: BP 114/68   Pulse (!) 59   Temp 97.6 F (36.4 C) (Oral)   Ht 5' (1.524 m)   Wt 149 lb (67.6 kg)   SpO2 96%   BMI 29.10 kg/m  Physical  Exam Vitals and nursing note reviewed.  Constitutional:      General: She is not in acute distress.    Appearance: Normal appearance. She is not ill-appearing or toxic-appearing.  HENT:     Head: Normocephalic and atraumatic.     Right Ear: Tympanic membrane, ear canal and external ear normal. There is no impacted cerumen.     Left Ear: Tympanic membrane, ear canal and external ear normal. There is no impacted cerumen.     Nose: No congestion or rhinorrhea.     Mouth/Throat:     Mouth: Mucous membranes are moist.     Pharynx: Oropharynx is clear. No oropharyngeal exudate or posterior oropharyngeal erythema.  Eyes:     General: No scleral icterus.       Right eye: No discharge.        Left eye: No discharge.     Extraocular Movements: Extraocular movements intact.     Conjunctiva/sclera: Conjunctivae normal.     Pupils: Pupils are equal, round, and reactive to light.  Cardiovascular:     Rate and Rhythm: Normal rate and regular rhythm.     Pulses: Normal pulses.     Heart sounds: Normal heart sounds. No murmur heard.    No friction rub. No gallop.  Pulmonary:     Effort: Pulmonary effort is normal. No respiratory distress.     Breath sounds: Normal breath sounds. No stridor. No wheezing, rhonchi or rales.  Chest:     Chest wall: No tenderness.  Abdominal:     General: Abdomen is flat. Bowel sounds are normal. There is no distension.     Palpations: Abdomen is soft. There is no mass.     Tenderness: There is no abdominal tenderness. There is no right CVA tenderness, left CVA tenderness, guarding or rebound.     Hernia: No hernia is present.  Musculoskeletal:        General: No swelling, tenderness or deformity. Normal range of motion.     Cervical back: Normal range of motion and neck supple. No rigidity or tenderness.     Right lower leg: No edema.     Left lower leg: No edema.  Lymphadenopathy:     Cervical: No cervical adenopathy.  Skin:    General: Skin is warm and dry.      Coloration: Skin is not jaundiced or pale.     Findings: No bruising, erythema, lesion or rash.  Neurological:     General: No focal deficit present.     Mental Status: She is alert and oriented to person, place, and time. Mental status is at baseline.  Cranial Nerves: No cranial nerve deficit.     Sensory: No sensory deficit.     Motor: No weakness.     Coordination: Coordination normal.     Gait: Gait normal.     Deep Tendon Reflexes: Reflexes normal.  Psychiatric:        Mood and Affect: Mood normal.        Behavior: Behavior normal.        Thought Content: Thought content normal.        Judgment: Judgment normal.       No results found.  Assessment/plan: Jaime Michael is a 66 y.o. 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 Ardis Hughs, polyp, 5 year. > DUE> pt provided with # to contact her GI team Mammogram: completed 09/2020; No family history. [facility] has scheduled 09/20/2021 Immunizations: tdap UTD 2018. Declined flu shot. Shingrix completed, covid x3, PNA20  Infectious disease screening: HIV and Hep C completed  DEXA: 2018- normal Patient was encouraged to exercise greater than 150 minutes a week. Patient was encouraged to choose a diet filled with fresh fruits and vegetables, and lean meats. AVS provided to patient today for education/recommendation on gender specific health and safety maintenance.  Return in about 1 year (around 10/11/2022) for cpe (20 min).   Orders Placed This Encounter  Procedures   Pneumococcal conjugate  vaccine 20-valent (Prevnar 20)   CBC   Comprehensive metabolic panel   Hemoglobin A1c   Lipid panel   TSH   VITAMIN D 25 Hydroxy (Vit-D Deficiency, Fractures)   No orders of the defined types were placed in this encounter.  Referral Orders  No referral(s) requested today     Electronically signed by: Howard Pouch, [place]

## 2021-10-11 ENCOUNTER — Encounter: Payer: Self-pay | Admitting: Gastroenterology

## 2021-10-16 ENCOUNTER — Ambulatory Visit (INDEPENDENT_AMBULATORY_CARE_PROVIDER_SITE_OTHER): Payer: BC Managed Care – PPO

## 2021-10-16 DIAGNOSIS — Z1231 Encounter for screening mammogram for malignant neoplasm of breast: Secondary | ICD-10-CM

## 2021-11-08 ENCOUNTER — Ambulatory Visit (AMBULATORY_SURGERY_CENTER): Payer: Self-pay | Admitting: *Deleted

## 2021-11-08 VITALS — Ht 60.0 in | Wt 140.0 lb

## 2021-11-08 DIAGNOSIS — Z86010 Personal history of colonic polyps: Secondary | ICD-10-CM

## 2021-11-08 MED ORDER — NA SULFATE-K SULFATE-MG SULF 17.5-3.13-1.6 GM/177ML PO SOLN: 1.0000 | ORAL | 0 refills | Status: DC

## 2021-11-08 MED ORDER — ONDANSETRON HCL 4 MG PO TABS: 4.0000 mg | ORAL_TABLET | ORAL | 0 refills | Status: DC

## 2021-11-08 NOTE — Progress Notes (Signed)
No egg or soy allergy known to patient  No issues known to pt with past sedation with any surgeries or procedures Patient denies ever being told they had issues or difficulty with intubation  No FH of Malignant Hyperthermia Pt is not on diet pills Pt is not on  home 02  Pt is not on blood thinners  Pt denies issues with constipation  No A fib or A flutter Have any cardiac testing pending--denied Pt instructed to use Singlecare.com or GoodRx for a price reduction on prep   

## 2021-12-02 ENCOUNTER — Encounter: Payer: Self-pay | Admitting: Internal Medicine

## 2021-12-05 DIAGNOSIS — R55 Syncope and collapse: Secondary | ICD-10-CM | POA: Insufficient documentation

## 2021-12-05 HISTORY — DX: Syncope and collapse: R55

## 2021-12-09 ENCOUNTER — Encounter: Payer: Self-pay | Admitting: Gastroenterology

## 2021-12-09 ENCOUNTER — Ambulatory Visit (AMBULATORY_SURGERY_CENTER): Payer: BC Managed Care – PPO | Admitting: Gastroenterology

## 2021-12-09 VITALS — BP 121/67 | HR 62 | Temp 98.7°F | Resp 13 | Ht 60.0 in | Wt 140.0 lb

## 2021-12-09 DIAGNOSIS — Z86010 Personal history of colonic polyps: Secondary | ICD-10-CM | POA: Diagnosis not present

## 2021-12-09 DIAGNOSIS — Z09 Encounter for follow-up examination after completed treatment for conditions other than malignant neoplasm: Secondary | ICD-10-CM

## 2021-12-09 MED ORDER — SODIUM CHLORIDE 0.9 % IV SOLN: 500.0000 mL | Freq: Once | INTRAVENOUS | Status: DC

## 2021-12-09 NOTE — Progress Notes (Signed)
Pt's states no medical or surgical changes since previsit or office visit.  Pt had a syncopal episode 12/05/21 when on an airplane, MD and CRNA made aware.

## 2021-12-09 NOTE — Progress Notes (Signed)
[facility] History and Physical   Primary Care Physician:  Ma Hillock, DO   Reason for Procedure:   History of polyps  Plan:     colonoscopy     HPI: Jaime Michael is a 66 y.o. 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 Authorizing Provider  Calcium Carb-Cholecalciferol 500-400 MG-UNIT CHEW Chew 1 tablet by mouth 2 (two) times daily.    Yes [provider]  Cholecalciferol (VITAMIN D-3) 5000 UNIT/ML LIQD Place under the tongue.   Yes [provider]  metoprolol tartrate (LOPRESSOR) 25 MG tablet Take 0.5 tablets (12.5 mg total) by mouth 2 (two) times daily. 07/11/21  Yes Revankar, Reita Cliche, MD  ondansetron (ZOFRAN) 4 MG tablet Take 1 tablet (4 mg total) by mouth as directed. Take 1 tab 30-60 minutes prior to each prep dose Patient not taking: Reported on 12/09/2021 11/08/21   Milus Banister, MD    Current Outpatient Medications  Medication Sig Dispense Refill   Calcium Carb-Cholecalciferol 500-400 MG-UNIT CHEW Chew 1 tablet by mouth 2 (two) times daily.      Cholecalciferol (VITAMIN D-3) 5000 UNIT/ML LIQD Place under the tongue.     metoprolol tartrate (LOPRESSOR) 25 MG tablet Take 0.5 tablets (12.5 mg total) by mouth 2 (two) times daily. 90 tablet 3   ondansetron (ZOFRAN) 4 MG tablet Take 1 tablet (4 mg total) by mouth as directed. Take 1 tab 30-60 minutes prior to each prep  dose (Patient not taking: Reported on 12/09/2021) 2 tablet 0   Current Facility-Administered Medications  Medication Dose Route Frequency Provider Last Rate Last Admin   0.9 %  sodium chloride infusion  500 mL Intravenous Once Jackquline Denmark, MD        Allergies as of 12/09/2021   (No Known Allergies)    Family History  Problem Relation Age of Onset   Stomach cancer Father    Clotting disorder Brother    Clotting disorder Son    Clotting disorder Cousin        maternal side   Clotting disorder Nephew        maternal side   Colon cancer Neg Hx    Colon polyps Neg Hx    Rectal cancer Neg Hx     Social History   Socioeconomic History   Marital status: Married    Spouse name: Ahamd   Number of children: 1   Years of education: PhD   Highest education level: Not on file  Occupational History   Occupation: Pharmacist, hospital  Tobacco Use   Smoking status: Never    Passive exposure: Never   Smokeless tobacco: Never  Vaping Use   Vaping Use: Never used  Substance and Sexual Activity   Alcohol use: No   Drug use: No   Sexual activity: Never    Partners: Male  Birth control/protection: None    Comment: Married  Other Topics Concern   Not on file  Social History Narrative   Married to [place]. One child   PHD/professor   Drinks caffeine, uses herbal remedies.   Wears her seatbelt. Smoke detectors in the home.   Feels safe in her relationships.   Social Determinants of Health   Financial Resource Strain: Not on file  Food Insecurity: Not on file  Transportation Needs: Not on file  Physical Activity: Not on file  Stress: Not on file  Social Connections: Not on file  Intimate Partner Violence: Not on file    Review of Systems: Positive for none All other review of systems negative except as mentioned in the HPI.  Physical Exam: Vital signs in last 24 hours: '@VSRANGES'$ @   General:   Alert,  Well-developed, well-nourished, pleasant and cooperative in NAD Lungs:  Clear  throughout to auscultation.   Heart:  Regular rate and rhythm; no murmurs, clicks, rubs,  or gallops. Abdomen:  Soft, nontender and nondistended. Normal bowel sounds.   Neuro/Psych:  Alert and cooperative. Normal mood and affect. A and O x 3    No significant changes were identified.  The patient continues to be an appropriate candidate for the planned procedure and anesthesia.   Carmell Austria, MD. [facility] 12/09/2021 2:22 PM@

## 2021-12-09 NOTE — Op Note (Signed)
[facility] Patient Name: Jaime Michael Procedure Date: 12/09/2021 2:32 PM MRN: [id number] Endoscopist: Jackquline Denmark , MD Age: 66 Referring MD:  Date of Birth: 06-12-1955 Gender: Female Account #: [id number] Procedure:                Colonoscopy Indications:              High risk colon cancer surveillance: Personal                            history of colonic polyps Medicines:                Monitored Anesthesia Care Procedure:                Pre-Anesthesia Assessment:                           - Prior to the procedure, a History and Physical                            was performed, and patient medications and                            allergies were reviewed. The patient's tolerance of                            previous anesthesia was also reviewed. The risks                            and benefits of the procedure and the sedation                            options and risks were discussed with the patient.                            All questions were answered, and informed consent                            was obtained. Prior Anticoagulants: The patient has                            taken no previous anticoagulant or antiplatelet                            agents. ASA Grade Assessment: II - A patient with                            mild systemic disease. After reviewing the risks                            and benefits, the patient was deemed in                            satisfactory condition to undergo the procedure.  After obtaining informed consent, the colonoscope                            was passed under direct vision. Throughout the                            procedure, the patient's blood pressure, pulse, and                            oxygen saturations were monitored continuously. The                            Olympus PCF-H190DL (#[id number]) Colonoscope was                            introduced through the anus and advanced to  the 2                            cm into the ileum. The colonoscopy was performed                            without difficulty. The patient tolerated the                            procedure well. The quality of the bowel                            preparation was good. The ileocecal valve,                            appendiceal orifice, and rectum were photographed. Scope In: 2:41:09 PM Scope Out: 2:52:49 PM Scope Withdrawal Time: 0 hours 7 minutes 33 seconds  Total Procedure Duration: 0 hours 11 minutes 40 seconds  Findings:                 The colon (entire examined portion) appeared normal.                           Non-bleeding external and internal hemorrhoids were                            found during retroflexion and during perianal exam.                            The hemorrhoids were small and Grade I (internal                            hemorrhoids that do not prolapse).                           The terminal ileum appeared normal.                           The exam was otherwise without abnormality on  direct and retroflexion views. Complications:            No immediate complications. Estimated Blood Loss:     Estimated blood loss: none. Impression:               - Non-bleeding external and internal hemorrhoids.                           - Otherwise normal colonoscopy to TI.                           - No specimens collected. Recommendation:           - Patient has a contact number available for                            emergencies. The signs and symptoms of potential                            delayed complications were discussed with the                            patient. Return to normal activities tomorrow.                            Written discharge instructions were provided to the                            patient.                           - Resume previous diet.                           - Continue present medications.                            - Repeat colonoscopy in 10 years for screening                            purposes. Earlier, if with any new problems or                            change in family history.                           - The findings and recommendations were discussed                            with the patient's family. Jackquline Denmark, MD 12/09/2021 2:57:38 PM This report has been signed electronically.

## 2021-12-09 NOTE — Patient Instructions (Signed)
YOU HAD AN ENDOSCOPIC PROCEDURE TODAY AT THE [facility]:   Refer to the procedure report that was given to you for any specific questions about what was found during the examination.  If the procedure report does not answer your questions, please call your gastroenterologist to clarify.  If you requested that your care partner not be given the details of your procedure findings, then the procedure report has been included in a sealed envelope for you to review at your convenience later.  YOU SHOULD EXPECT: Some feelings of bloating in the abdomen. Passage of more gas than usual.  Walking can help get rid of the air that was put into your GI tract during the procedure and reduce the bloating. If you had a lower endoscopy (such as a colonoscopy or flexible sigmoidoscopy) you may notice spotting of blood in your stool or on the toilet paper. If you underwent a bowel prep for your procedure, you may not have a normal bowel movement for a few days.  Please Note:  You might notice some irritation and congestion in your nose or some drainage.  This is from the oxygen used during your procedure.  There is no need for concern and it should clear up in a day or so.  SYMPTOMS TO REPORT IMMEDIATELY:  Following lower endoscopy (colonoscopy or flexible sigmoidoscopy):  Excessive amounts of blood in the stool  Significant tenderness or worsening of abdominal pains  Swelling of the abdomen that is new, acute  Fever of 100F or higher  For urgent or emergent issues, a gastroenterologist can be reached at any hour by calling [phone]. Do not use MyChart messaging for urgent concerns.    DIET:  We do recommend a small meal at first, but then you may proceed to your regular diet.  Drink plenty of fluids but you should avoid alcoholic beverages for 24 hours.  ACTIVITY:  You should plan to take it easy for the rest of today and you should NOT DRIVE or use heavy machinery until tomorrow (because of  the sedation medicines used during the test).    FOLLOW UP: Our staff will call the number listed on your records the next business day following your procedure.  We will call around 7:15- 8:00 am to check on you and address any questions or concerns that you may have regarding the information given to you following your procedure. If we do not reach you, we will leave a message.  If you develop any symptoms (ie: fever, flu-like symptoms, shortness of breath, cough etc.) before then, please call [phone].  If you test positive for Covid 19 in the 2 weeks post procedure, please call and report this information to us.    If any biopsies were taken you will be contacted by phone or by letter within the next 1-3 weeks.  Please call us at [phone] if you have not heard about the biopsies in 3 weeks.    SIGNATURES/CONFIDENTIALITY: You and/or your care partner have signed paperwork which will be entered into your electronic medical record.  These signatures attest to the fact that that the information above on your After Visit Summary has been reviewed and is understood.  Full responsibility of the confidentiality of this discharge information lies with you and/or your care-partner.  

## 2021-12-09 NOTE — Progress Notes (Signed)
To pacu, VSS. Report to [email or web address] 

## 2021-12-10 ENCOUNTER — Telehealth: Payer: Self-pay

## 2021-12-10 NOTE — Telephone Encounter (Signed)
  Follow up Call-     12/09/2021    1:58 PM  Call back number  Post procedure Call Back phone  # [phone]  Permission to leave phone message Yes     Patient questions:  Do you have a fever, pain , or abdominal swelling? No. Pain Score  0 *  Have you tolerated food without any problems? Yes.    Have you been able to return to your normal activities? Yes.    Do you have any questions about your discharge instructions: Diet   No. Medications  No. Follow up visit  No.  Do you have questions or concerns about your Care? No.  Actions: * If pain score is 4 or above: No action needed, pain <4.

## 2021-12-18 ENCOUNTER — Other Ambulatory Visit: Payer: Self-pay

## 2021-12-18 DIAGNOSIS — D6859 Other primary thrombophilia: Secondary | ICD-10-CM | POA: Insufficient documentation

## 2021-12-19 ENCOUNTER — Encounter: Payer: Self-pay | Admitting: Cardiology

## 2021-12-19 ENCOUNTER — Ambulatory Visit: Payer: BC Managed Care – PPO | Attending: Cardiology | Admitting: Cardiology

## 2021-12-19 VITALS — BP 104/68 | HR 64 | Ht 60.0 in | Wt 145.1 lb

## 2021-12-19 DIAGNOSIS — R079 Chest pain, unspecified: Secondary | ICD-10-CM

## 2021-12-19 DIAGNOSIS — R55 Syncope and collapse: Secondary | ICD-10-CM

## 2021-12-19 DIAGNOSIS — R42 Dizziness and giddiness: Secondary | ICD-10-CM

## 2021-12-19 HISTORY — DX: Chest pain, unspecified: R07.9

## 2021-12-19 NOTE — Progress Notes (Signed)
Cardiology Office Note:    Date:  12/19/2021   ID:  Jaime Michael, DOB 1955/08/31, MRN [id number]  PCP:  Ma Hillock, DO  Cardiologist:  Jenean Lindau, MD   Referring MD: Ma Hillock, DO    ASSESSMENT:    1. Postural dizziness with presyncope   2. Syncope, unspecified syncope type   3. Chest pain of uncertain etiology    PLAN:    In order of problems listed above:  Primary prevention stressed with the patient.  Importance of compliance with diet and medication stressed and she vocalized understanding. Syncope: Chest pain: She is worried about the symptoms.  Syncope will need to be evaluated.  That this happens to only in the environment flying in a plane.  I told her that we will do a monitoring next time she wants to fly and assess her cardiac rhythm.  She is agreeable. Chest pain: Atypical in nature but to reassure her we will do exercise stress echo Blood work including lipids were reviewed and diet was emphasized. Patient will be seen in follow-up appointment in 9 months or earlier if the patient has any concerns    Medication Adjustments/Labs and Tests Ordered: Current medicines are reviewed at length with the patient today.  Concerns regarding medicines are outlined above.  Orders Placed This Encounter  Procedures   Cardiac Stress Test: Informed Consent Details: Physician/Practitioner Attestation; Transcribe to consent form and obtain patient signature   ECHOCARDIOGRAM STRESS TEST   No orders of the defined types were placed in this encounter.    No chief complaint on file.    History of Present Illness:    Jaime Michael is a 66 y.o. female.  Patient is no significant past medical history.  There is history of protein C deficiency mentioned in the chart.  This is managed by primary care.  Patient mentions to me that overall she is fine and exercises regularly.  Sometimes she has chest pain.  This is not related to exertion.  Her more important question is  that last time when she was flying out of the country she had a syncopal spell.  Subsequently her heart rate and blood pressure was monitored and it was fine.  She tells me that her oxygen was low at that point.  It is not clear to me based on her history as to what happened beyond the story.  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   Encounter for preventive health examination 04/25/2016   Family history of protein C deficiency 07/04/2021   Palpitations 01/27/2017   Pes planus of right foot 10/05/2020   Postmenopausal 05/12/2016   Postural dizziness with presyncope 06/18/2018   Protein C deficiency ([facility])    Protein deficiency ([facility]) 07/17/2021   Syncope 12/05/2021   Vitamin D deficiency 04/28/2016    Past Surgical History:  Procedure Laterality Date   CESAREAN SECTION     COLONOSCOPY  2018   Dr Ardis Hughs   DILATION AND CURETTAGE OF UTERUS     X4    Current Medications: Current Meds  Medication Sig   Calcium Carb-Cholecalciferol 500-400 MG-UNIT CHEW Chew 1 tablet by mouth 2 (two) times daily.    Cholecalciferol (VITAMIN D-3) 5000 UNIT/ML LIQD Place 5,000 Units under the tongue daily.   metoprolol tartrate (LOPRESSOR) 25 MG tablet Take 0.5 tablets (12.5 mg total) by mouth 2 (two) times daily.     Allergies:   Patient has no known  allergies.   Social History   Socioeconomic History   Marital status: Married    Spouse name: Ahamd   Number of children: 1   Years of education: PhD   Highest education level: Not on file  Occupational History   Occupation: Pharmacist, hospital  Tobacco Use   Smoking status: Never    Passive exposure: Never   Smokeless tobacco: Never  Vaping Use   Vaping Use: Never used  Substance and Sexual Activity   Alcohol use: No   Drug use: No   Sexual activity: Never    Partners: Male    Birth control/protection: None    Comment: Married  Other Topics Concern   Not on file  Social History Narrative    Married to Luther. One child   PHD/professor   Drinks caffeine, uses herbal remedies.   Wears her seatbelt. Smoke detectors in the home.   Feels safe in her relationships.   Social Determinants of Health   Financial Resource Strain: Not on file  Food Insecurity: Not on file  Transportation Needs: Not on file  Physical Activity: Not on file  Stress: Not on file  Social Connections: Not on file     Family History: The patient's family history includes Clotting disorder in her brother, cousin, nephew, and son; Stomach cancer in her father. There is no history of Colon cancer, Colon polyps, or Rectal cancer.  ROS:   Please see the history of present illness.    All other systems reviewed and are negative.  EKGs/Labs/Other Studies Reviewed:    The following studies were reviewed today: I discussed my findings with the patient at length   Recent Labs: 10/09/2021: ALT 14; BUN 15; Creatinine, Ser 0.98; Hemoglobin 15.2; Platelets 434.0; Potassium 4.4; Sodium 141; TSH 1.21  Recent Lipid Panel    Component Value Date/Time   CHOL 196 10/09/2021 1002   TRIG 107.0 10/09/2021 1002   HDL 72.50 10/09/2021 1002   CHOLHDL 3 10/09/2021 1002   VLDL 21.4 10/09/2021 1002   LDLCALC 102 (H) 10/09/2021 1002    Physical Exam:    VS:  BP 104/68   Pulse 64   Ht 5' (1.524 m)   Wt 145 lb 1.9 oz (65.8 kg)   SpO2 95%   BMI 28.34 kg/m     Wt Readings from Last 3 Encounters:  12/19/21 145 lb 1.9 oz (65.8 kg)  12/09/21 140 lb (63.5 kg)  11/08/21 140 lb (63.5 kg)     GEN: Patient is in no acute distress HEENT: Normal NECK: No JVD; No carotid bruits LYMPHATICS: No lymphadenopathy CARDIAC: Hear sounds regular, 2/6 systolic murmur at the apex. RESPIRATORY:  Clear to auscultation without rales, wheezing or rhonchi  ABDOMEN: Soft, non-tender, non-distended MUSCULOSKELETAL:  No edema; No deformity  SKIN: Warm and dry NEUROLOGIC:  Alert and oriented x 3 PSYCHIATRIC:  Normal affect    Signed, Jenean Lindau, MD  12/19/2021 8:25 AM    [facility]

## 2021-12-19 NOTE — Patient Instructions (Signed)
Medication Instructions:  Your physician recommends that you continue on your current medications as directed. Please refer to the Current Medication list given to you today.  *If you need a refill on your cardiac medications before your next appointment, please call your pharmacy*   Lab Work: None ordered If you have labs (blood work) drawn today and your tests are completely normal, you will receive your results only by: [place] (if you have MyChart) OR A paper copy in the mail If you have any lab test that is abnormal or we need to change your treatment, we will call you to review the results.   Testing/Procedures:      Stress Echocardiogram Information Sheet                                                      Instructions:    1. You may take your morning medications the morning of the test  2. Light breakfast.  3. Dress prepared to exercise.  4. DO NOT use ANY caffeine or tobacco products 3 hours before appointment.  5. Please bring all current prescription medications.    Follow-Up: At [facility], you and your health needs are our priority.  As part of our continuing mission to provide you with exceptional heart care, we have created designated Provider Care Teams.  These Care Teams include your primary Cardiologist (physician) and Advanced Practice Providers (APPs -  Physician Assistants and Nurse Practitioners) who all work together to provide you with the care you need, when you need it.  We recommend signing up for the patient portal called "MyChart".  Sign up information is provided on this After Visit Summary.  MyChart is used to connect with patients for Virtual Visits (Telemedicine).  Patients are able to view lab/test results, encounter notes, upcoming appointments, etc.  Non-urgent messages can be sent to your provider as well.   To learn more about what you can do with MyChart, go to [email or web address].    Your next appointment:   12  month(s)  The format for your next appointment:   In Person  Provider:   Jyl Heinz, MD   Other Instructions Exercise Stress Echocardiogram An exercise stress echocardiogram is a test to check how well your heart is working. This test uses sound waves and a computer to make pictures of your heart. These pictures will be taken before and after you exercise. For this test, you will walk on a treadmill or ride a bicycle to make your heart beat faster. While you exercise, your heart will be checked with an electrocardiogram (ECG). Your blood pressure will also be checked. You may have this test if: You have chest pain or a heart problem. You had a heart attack or heart surgery not long ago. You have heart valve problems. You have a condition that causes narrowing of the blood vessels that supply your heart. You have a high risk of heart disease and: You are starting a new exercise program. You need to have a big surgery. Tell a doctor about: Any allergies you have. All medicines you are taking. This includes vitamins, herbs, eye drops, creams, and over-the-counter medicines. Any problems you or family members have had with medicines that make you fall asleep (anesthetic medicines). Any surgeries you have had. Any blood disorders you have. Any medical  conditions you have. Whether you are pregnant or Matlin be pregnant. What are the risks? Generally, this is a safe test. However, problems Matthes occur, including: Chest pain. Feeling dizzy or light-headed. Shortness of breath. Increased or irregular heartbeat. Feeling like you Melaragno vomit (nausea) or vomiting. Heart attack. This is very rare. What happens before the test? Medicines Ask your doctor about changing or stopping your normal medicines. This is important if you take diabetes medicines or blood thinners. If you use an inhaler, bring it to the test. General instructions Wear comfortable clothes and walking shoes. Follow  instructions from your doctor about what you cannot eat or drink before the test. Do not drink or eat anything that has caffeine in it. Stop having caffeine 24 hours before the test. Do not smoke or use products that contain nicotine or tobacco for 4 hours before the test. If you need help quitting, ask your doctor. What happens during the test?  You will take off your clothes from the waist up and put on a hospital gown. Electrodes or patches will be put on your chest. A blood pressure cuff will be put on your arm. Before you exercise, a computer will make a picture of your heart. To do this: You will lie down and a gel will be put on your chest. A wand will be moved over the gel. Sound waves from the wand will go to the computer to make the picture. Then, you will start to exercise. You Liby walk on a treadmill or pedal a bicycle. Your blood pressure and heart rhythm will be checked while you exercise. The exercise will get harder or faster. You will exercise until: Your heart reaches a certain level. You are too tired to go on. You cannot go on because of chest pain, weakness, or dizziness. You will lie down right away so another picture of your heart can be taken. The procedure Labrum vary among doctors and hospitals. What can I expect after the test? After your test, it is common to have: Mild soreness. Mild tiredness. Your heart rate and blood pressure will be checked until they return to your normal levels. You should not have any new symptoms after this test. Follow these instructions at home: If your doctor says that you can, you Panos: Eat what you normally eat. Do your normal activities. Take over-the-counter and prescription medicines only as told by your doctor. Keep all follow-up visits. It is up to you to get the results of your test. Ask how to get your results when they are ready. Contact a doctor if: You feel dizzy or light-headed. You have a fast or irregular  heartbeat. You feel like you Wiater vomit or you vomit. You have a headache. You feel short of breath. Get help right away if: You develop pain or pressure: In your chest. In your jaw or neck. Between your shoulders. That goes down your left arm. You faint. You have trouble breathing. These symptoms Mungin be an emergency. Get medical help right away. Call your local emergency services (911 in the [place] Do not wait to see if the symptoms will go away. Do not drive yourself to the hospital. Summary This is a test that checks how well your heart is working. Follow instructions about what you cannot eat or drink before the test. Ask your doctor if you should take your normal medicines before the test. Stop having caffeine 24 hours before the test. Do not smoke or use products with nicotine  or tobacco in them for 4 hours before the test. During the test, your blood pressure and heart rhythm will be checked while you exercise. This information is not intended to replace advice given to you by your health care provider. Make sure you discuss any questions you have with your health care provider. Document Revised: 12/19/2020 Document Reviewed: 11/29/2019 Elsevier Patient Education  2022 Elsevier Inc.   

## 2022-01-02 ENCOUNTER — Telehealth: Payer: Self-pay

## 2022-01-02 NOTE — Telephone Encounter (Signed)
Detailed instructions left on the patient's answering machine. Asked to call back with any questions. S.Zephaniah Enyeart EMTP 

## 2022-01-07 ENCOUNTER — Ambulatory Visit (HOSPITAL_COMMUNITY): Payer: BC Managed Care – PPO | Attending: Cardiology

## 2022-01-07 ENCOUNTER — Ambulatory Visit (HOSPITAL_COMMUNITY): Payer: BC Managed Care – PPO

## 2022-01-07 DIAGNOSIS — R55 Syncope and collapse: Secondary | ICD-10-CM | POA: Diagnosis present

## 2022-01-07 DIAGNOSIS — R42 Dizziness and giddiness: Secondary | ICD-10-CM | POA: Diagnosis present

## 2022-01-07 DIAGNOSIS — R079 Chest pain, unspecified: Secondary | ICD-10-CM | POA: Insufficient documentation

## 2022-01-07 MED ORDER — PERFLUTREN LIPID MICROSPHERE
1.0000 mL | INTRAVENOUS | Status: AC | PRN
  Administered 2022-01-07: 3 mL via INTRAVENOUS

## 2022-07-18 ENCOUNTER — Other Ambulatory Visit: Payer: Self-pay | Admitting: Cardiology

## 2022-09-02 ENCOUNTER — Encounter: Payer: Self-pay | Admitting: Family Medicine

## 2022-10-10 ENCOUNTER — Encounter: Payer: Self-pay | Admitting: Cardiology

## 2022-10-10 ENCOUNTER — Ambulatory Visit: Payer: BC Managed Care – PPO | Attending: Cardiology | Admitting: Cardiology

## 2022-10-10 ENCOUNTER — Encounter: Payer: BC Managed Care – PPO | Admitting: Family Medicine

## 2022-10-10 VITALS — BP 130/74 | HR 66 | Ht 60.0 in | Wt 153.0 lb

## 2022-10-10 DIAGNOSIS — R002 Palpitations: Secondary | ICD-10-CM | POA: Diagnosis not present

## 2022-10-10 DIAGNOSIS — R079 Chest pain, unspecified: Secondary | ICD-10-CM

## 2022-10-10 MED ORDER — METOPROLOL TARTRATE 25 MG PO TABS: 12.5000 mg | ORAL_TABLET | Freq: Two times a day (BID) | ORAL | 3 refills | Status: DC

## 2022-10-10 NOTE — Patient Instructions (Signed)
Medication Instructions:  Your physician recommends that you continue on your current medications as directed. Please refer to the Current Medication list given to you today.  *If you need a refill on your cardiac medications before your next appointment, please call your pharmacy*   Lab Work: None ordered If you have labs (blood work) drawn today and your tests are completely normal, you will receive your results only by: MyChart Message (if you have MyChart) OR A paper copy in the mail If you have any lab test that is abnormal or we need to change your treatment, we will call you to review the results.   Testing/Procedures: We will order CT coronary calcium score. It will cost $99.00 and iis due at time of scan.  Please call to schedule.     [facility] [place] [address] [place], [place] [address] [phone]    Follow-Up: At [facility], you and your health needs are our priority.  As part of our continuing mission to provide you with exceptional heart care, we have created designated Provider Care Teams.  These Care Teams include your primary Cardiologist (physician) and Advanced Practice Providers (APPs -  Physician Assistants and Nurse Practitioners) who all work together to provide you with the care you need, when you need it.  We recommend signing up for the patient portal called "MyChart".  Sign up information is provided on this After Visit Summary.  MyChart is used to connect with patients for Virtual Visits (Telemedicine).  Patients are able to view lab/test results, encounter notes, upcoming appointments, etc.  Non-urgent messages can be sent to your provider as well.   To learn more about what you can do with MyChart, go to [email or web address].    Your next appointment:   12 month(s)  The format for your next appointment:   In Person  Provider:   Rajan Revankar, MD    Other Instructions none  Important Information About  [place]       

## 2022-10-10 NOTE — Progress Notes (Signed)
Cardiology Office Note:    Date:  10/10/2022   ID:  Jaime Michael, DOB 1955/11/24, MRN [id number]  PCP:  Natalia Leatherwood, DO  Cardiologist:  Garwin Brothers, MD   Referring MD: Natalia Leatherwood, DO    ASSESSMENT:    1. Chest pain of uncertain etiology   2. Palpitations    PLAN:    In order of problems listed above:  Primary prevention stressed with the patient.  Importance of compliance with diet medication stressed and patient verbalized standing.  She was advised to walk at least half an hour a day 5 days a week and she promises to do so.  Weight reduction stressed diet emphasized. I discussed calcium scoring for coronary restratification and she is agreeable.  Further recommendations will remain on the findings.  Calcium score. Palpitations: She takes a low-dose of beta-blocker only on a as needed basis. Patient had multiple questions which were answered to her satisfaction. Patient will be seen in follow-up appointment in 12 months or earlier if the patient has any concerns.    Medication Adjustments/Labs and Tests Ordered: Current medicines are reviewed at length with the patient today.  Concerns regarding medicines are outlined above.  Orders Placed This Encounter  Procedures   CT CARDIAC SCORING   EKG 12-Lead   Meds ordered this encounter  Medications   metoprolol tartrate (LOPRESSOR) 25 MG tablet    Sig: Take 0.5 tablets (12.5 mg total) by mouth 2 (two) times daily.    Dispense:  90 tablet    Refill:  3     No chief complaint on file.    History of Present Illness:    Jaime Michael is a 67 y.o. female.  Patient is here for routine follow-up.  She denies any chest pain orthopnea or PND at this time.  She is overweight.  She leads a sedentary lifestyle.  She is here for follow-up.  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   Chest pain of uncertain etiology 12/19/2021   Encounter for preventive health  examination 04/25/2016   Family history of protein C deficiency 07/04/2021   Palpitations 01/27/2017   Pes planus of right foot 10/05/2020   Postmenopausal 05/12/2016   Postural dizziness with presyncope 06/18/2018   Protein C deficiency ([facility])    Protein deficiency ([facility]) 07/17/2021   Syncope 12/05/2021   Vitamin D deficiency 04/28/2016    Past Surgical History:  Procedure Laterality Date   CESAREAN SECTION     COLONOSCOPY  2018   Dr Christella Hartigan   DILATION AND CURETTAGE OF UTERUS     X4    Current Medications: Current Meds  Medication Sig   Calcium Carb-Cholecalciferol 500-400 MG-UNIT CHEW Chew 1 tablet by mouth 2 (two) times daily.    Cholecalciferol (VITAMIN D-3) 5000 UNIT/ML LIQD Place 5,000 Units under the tongue daily.   ondansetron (ZOFRAN) 4 MG tablet Take 1 tablet (4 mg total) by mouth as directed. Take 1 tab 30-60 minutes prior to each prep dose   [DISCONTINUED] metoprolol tartrate (LOPRESSOR) 25 MG tablet Take 0.5 tablets (12.5 mg total) by mouth 2 (two) times daily.     Allergies:   Patient has no known allergies.   Social History   Socioeconomic History   Marital status: Married    Spouse name: Ahamd   Number of children: 1   Years of education: PhD   Highest education level: Not on file  Occupational History  Occupation: Runner, broadcasting/film/video  Tobacco Use   Smoking status: Never    Passive exposure: Never   Smokeless tobacco: Never  Vaping Use   Vaping Use: Never used  Substance and Sexual Activity   Alcohol use: No   Drug use: No   Sexual activity: Never    Partners: Male    Birth control/protection: None    Comment: Married  Other Topics Concern   Not on file  Social History Narrative   Married to Lynden. One child   PHD/professor   Drinks caffeine, uses herbal remedies.   Wears her seatbelt. Smoke detectors in the home.   Feels safe in her relationships.   Social Determinants of Health   Financial Resource Strain: Not on file  Food Insecurity: Not on  file  Transportation Needs: Not on file  Physical Activity: Not on file  Stress: Not on file  Social Connections: Not on file     Family History: The patient's family history includes Clotting disorder in her brother, cousin, nephew, and son; Stomach cancer in her father. There is no history of Colon cancer, Colon polyps, or Rectal cancer.  ROS:   Please see the history of present illness.    All other systems reviewed and are negative.  EKGs/Labs/Other Studies Reviewed:    The following studies were reviewed today: EKG revealed sinus rhythm and nonspecific ST-T changes   Recent Labs: No results found for requested labs within last 365 days.  Recent Lipid Panel    Component Value Date/Time   CHOL 196 10/09/2021 1002   TRIG 107.0 10/09/2021 1002   HDL 72.50 10/09/2021 1002   CHOLHDL 3 10/09/2021 1002   VLDL 21.4 10/09/2021 1002   LDLCALC 102 (H) 10/09/2021 1002    Physical Exam:    VS:  BP 130/74   Pulse 66   Ht 5' (1.524 m)   Wt 153 lb 0.6 oz (69.4 kg)   SpO2 94%   BMI 29.89 kg/m     Wt Readings from Last 3 Encounters:  10/10/22 153 lb 0.6 oz (69.4 kg)  12/19/21 145 lb 1.9 oz (65.8 kg)  12/09/21 140 lb (63.5 kg)     GEN: Patient is in no acute distress HEENT: Normal NECK: No JVD; No carotid bruits LYMPHATICS: No lymphadenopathy CARDIAC: Hear sounds regular, 2/6 systolic murmur at the apex. RESPIRATORY:  Clear to auscultation without rales, wheezing or rhonchi  ABDOMEN: Soft, non-tender, non-distended MUSCULOSKELETAL:  No edema; No deformity  SKIN: Warm and dry NEUROLOGIC:  Alert and oriented x 3 PSYCHIATRIC:  Normal affect   Signed, Garwin Brothers, MD  10/10/2022 10:18 AM    [facility]

## 2022-10-14 ENCOUNTER — Encounter: Payer: BC Managed Care – PPO | Admitting: Family Medicine

## 2022-10-15 ENCOUNTER — Ambulatory Visit (HOSPITAL_BASED_OUTPATIENT_CLINIC_OR_DEPARTMENT_OTHER)
Admission: RE | Admit: 2022-10-15 | Discharge: 2022-10-15 | Disposition: A | Discharge status: Home / Self Care | Payer: Self-pay | Source: Ambulatory Visit | Attending: Cardiology | Admitting: Cardiology

## 2022-10-15 DIAGNOSIS — R079 Chest pain, unspecified: Secondary | ICD-10-CM | POA: Insufficient documentation

## 2022-10-16 ENCOUNTER — Telehealth: Payer: Self-pay | Admitting: Cardiology

## 2022-10-16 NOTE — Telephone Encounter (Signed)
Pt calling back to discuss results. Please advise.

## 2022-10-16 NOTE — Telephone Encounter (Signed)
Results reviewed with pt as per Dr. Revankar's note.  Pt verbalized understanding and had no additional questions. Routed to PCP.  

## 2022-10-18 ENCOUNTER — Other Ambulatory Visit (HOSPITAL_BASED_OUTPATIENT_CLINIC_OR_DEPARTMENT_OTHER): Payer: Self-pay

## 2022-10-18 ENCOUNTER — Ambulatory Visit (HOSPITAL_BASED_OUTPATIENT_CLINIC_OR_DEPARTMENT_OTHER)
Admission: RE | Admit: 2022-10-18 | Discharge: 2022-10-18 | Disposition: A | Discharge status: Home / Self Care | Payer: BC Managed Care – PPO | Source: Ambulatory Visit | Attending: Family Medicine | Admitting: Family Medicine

## 2022-10-18 DIAGNOSIS — Z1231 Encounter for screening mammogram for malignant neoplasm of breast: Secondary | ICD-10-CM

## 2022-11-07 IMAGING — MG MM DIGITAL SCREENING BILAT W/ TOMO AND CAD
8 series · 9 of 24 positions shown · non-contrast
Comparison: Previous exam(s).

CLINICAL DATA: Screening.

EXAM:
DIGITAL SCREENING BILATERAL MAMMOGRAM WITH TOMOSYNTHESIS AND CAD
TECHNIQUE: Bilateral screening digital craniocaudal and mediolateral oblique
mammograms were obtained. Bilateral screening digital breast
tomosynthesis was performed. The images were evaluated with
computer-aided detection.

[L MLO synth-2D]
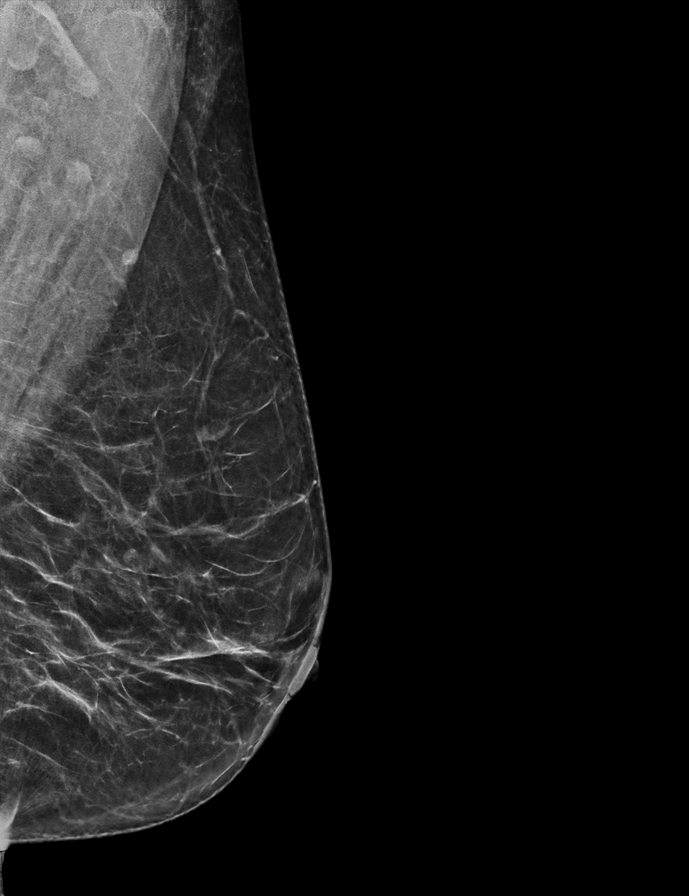

[R MLO synth-2D]
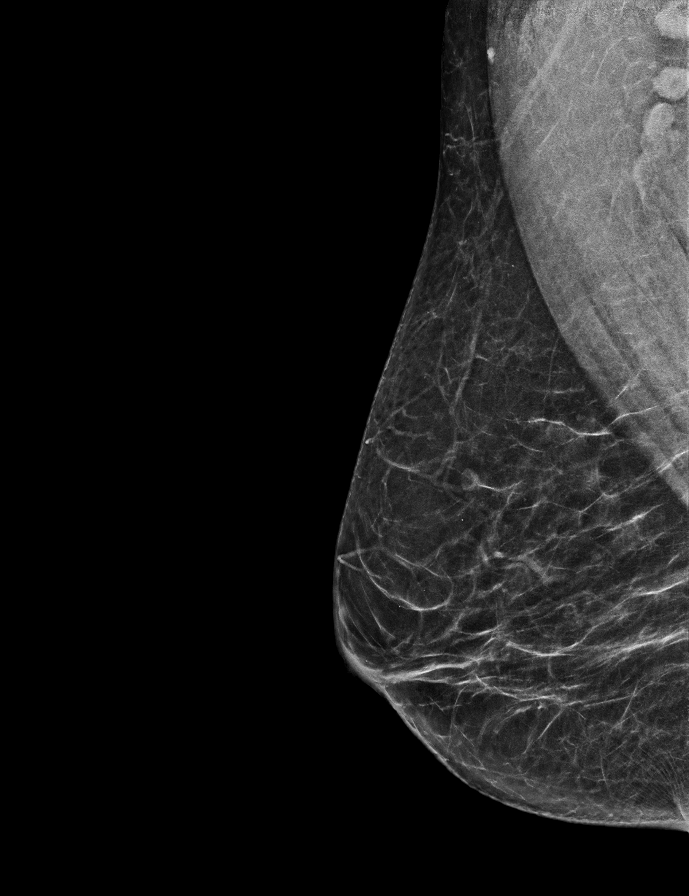

[L CC synth-2D]
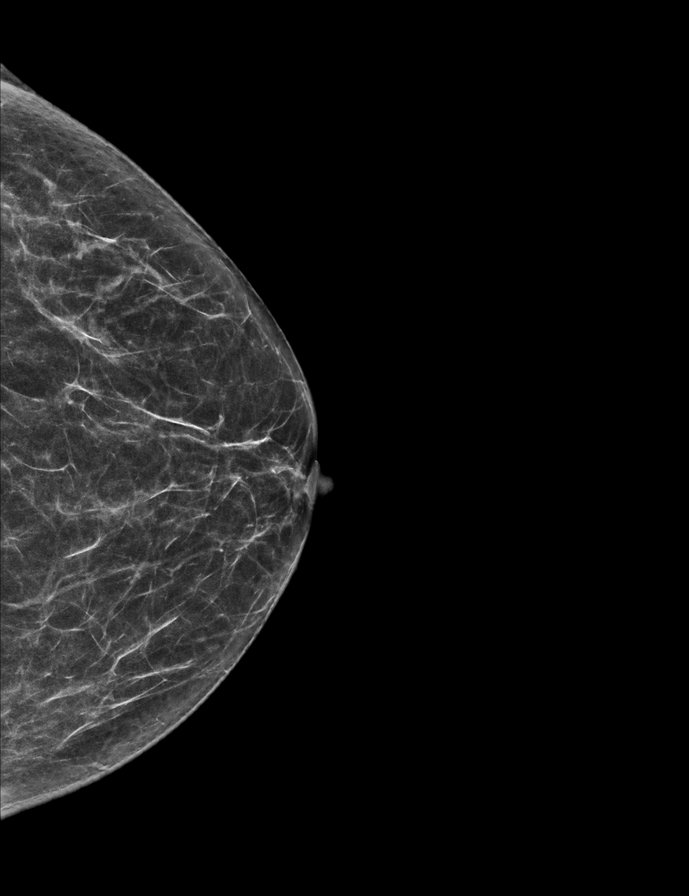

[R CC synth-2D]
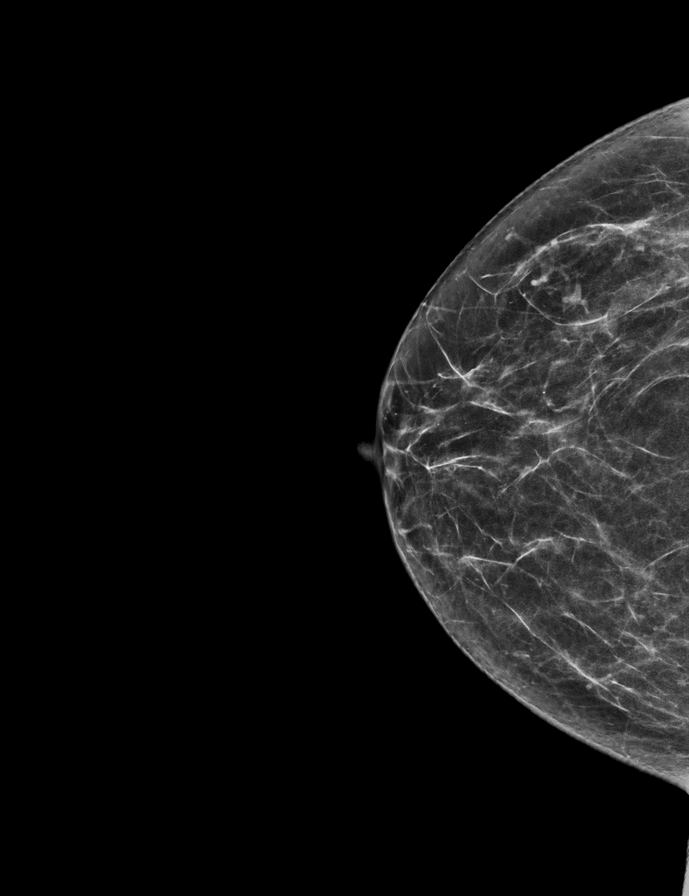

[R MLO tomo · 2 of 62 frames shown]
[frame 21/62]
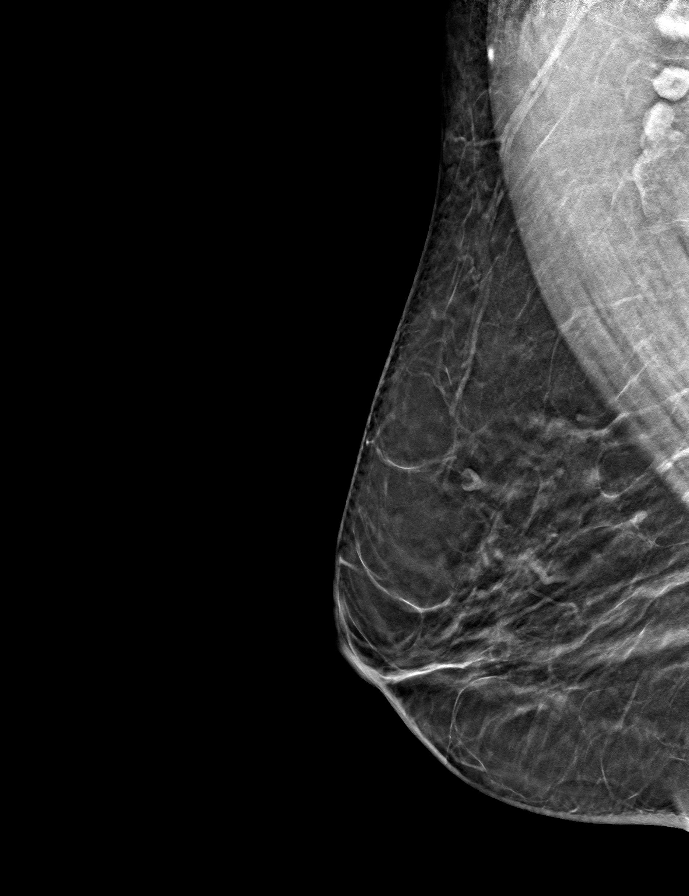
[frame 31/62]
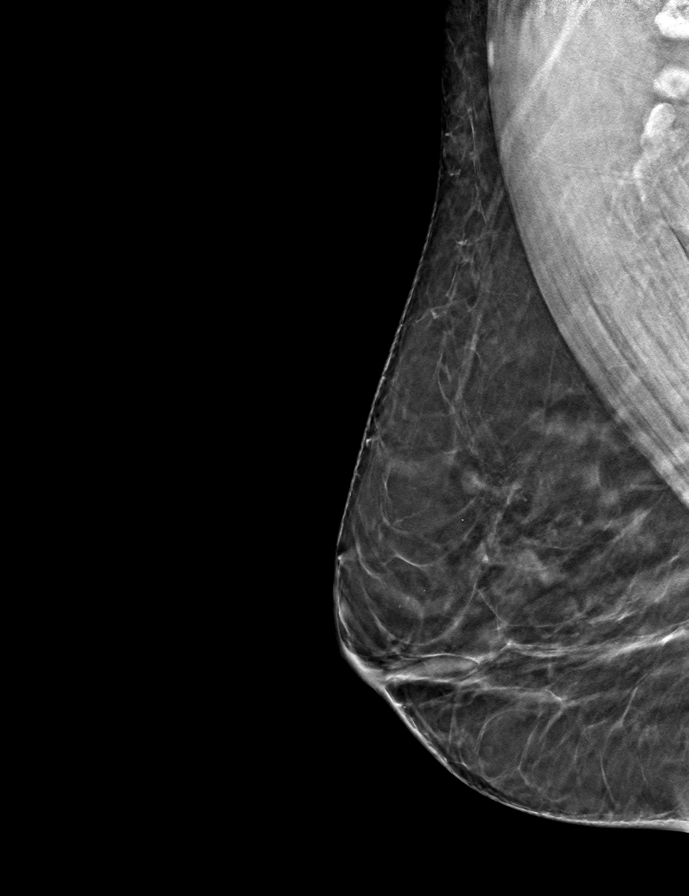

[L MLO tomo · tomo slice 32/63.0]
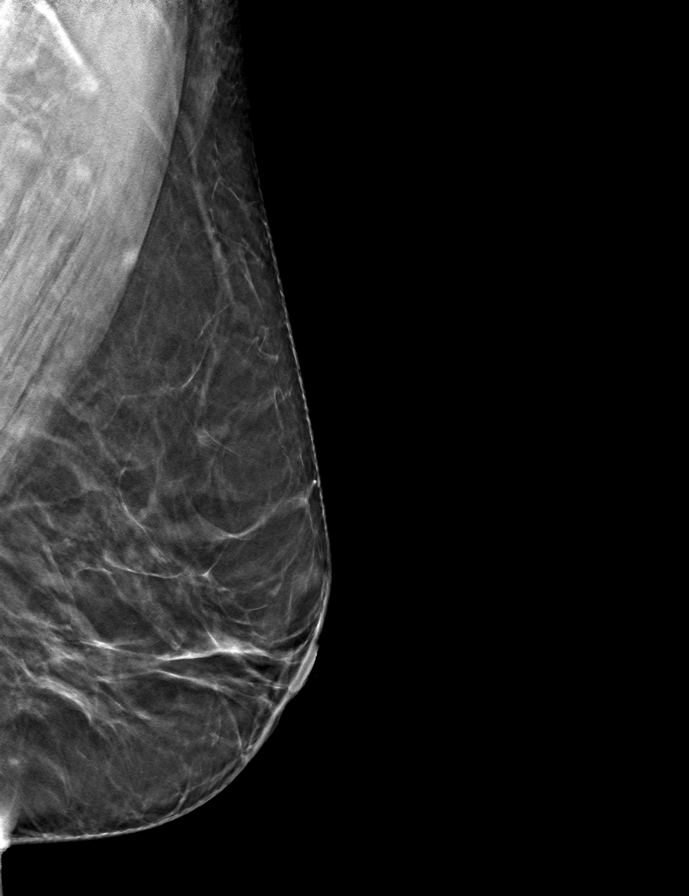

[R CC tomo · tomo slice 28/55.0]
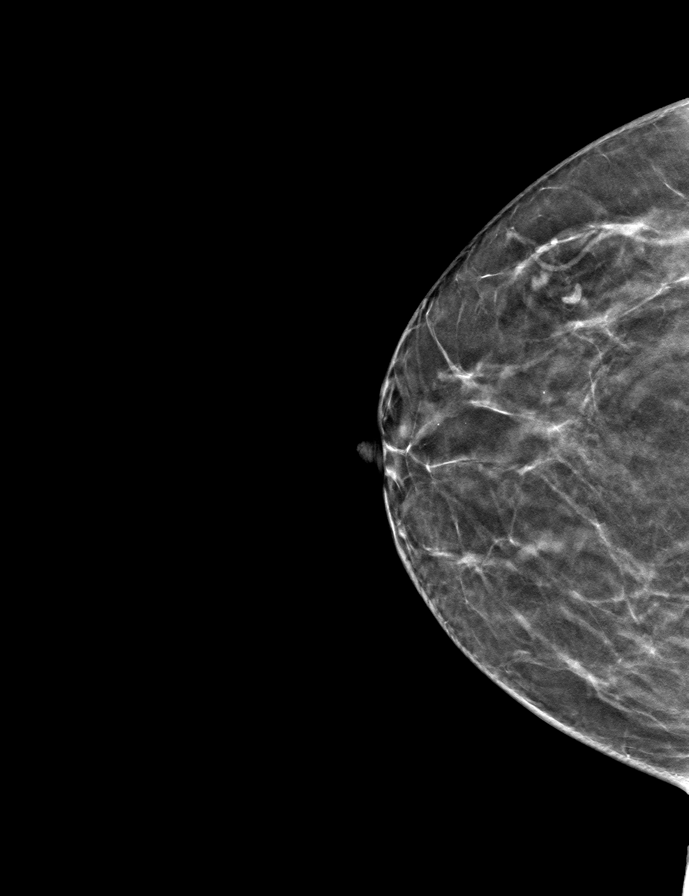

[L CC tomo · tomo slice 29/56.0]
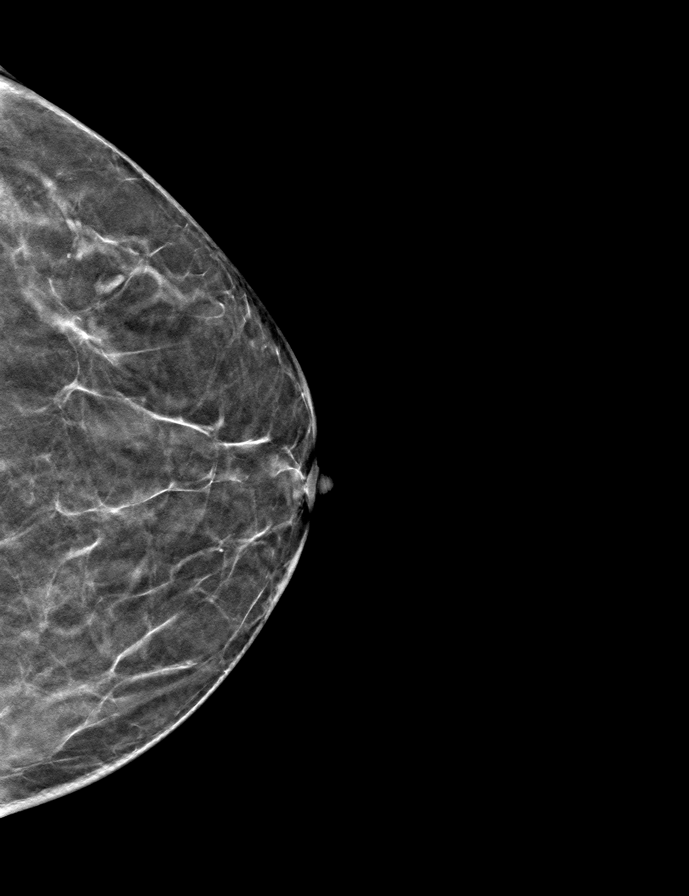

[9 of 24 positions shown; findings below may reference images not displayed]

ACR Breast Density Category b: There are scattered areas of
fibroglandular density.
FINDINGS: There are no findings suspicious for malignancy.
IMPRESSION: No mammographic evidence of malignancy. A result letter of this
screening mammogram will be mailed directly to the patient.

RECOMMENDATION:
Screening mammogram in one year. (Code:51-O-LD2)

BI-RADS CATEGORY  1: Negative.

## 2023-09-18 ENCOUNTER — Encounter: Payer: Self-pay | Admitting: Family Medicine

## 2023-09-18 ENCOUNTER — Ambulatory Visit: Payer: Self-pay | Admitting: Family Medicine

## 2023-09-18 VITALS — BP 126/84 | HR 65 | Temp 98.4°F | Ht 60.0 in | Wt 155.2 lb

## 2023-09-18 DIAGNOSIS — Z13220 Encounter for screening for lipoid disorders: Secondary | ICD-10-CM | POA: Diagnosis not present

## 2023-09-18 DIAGNOSIS — H9190 Unspecified hearing loss, unspecified ear: Secondary | ICD-10-CM

## 2023-09-18 DIAGNOSIS — Z780 Asymptomatic menopausal state: Secondary | ICD-10-CM | POA: Diagnosis not present

## 2023-09-18 DIAGNOSIS — E559 Vitamin D deficiency, unspecified: Secondary | ICD-10-CM | POA: Diagnosis not present

## 2023-09-18 DIAGNOSIS — Z1231 Encounter for screening mammogram for malignant neoplasm of breast: Secondary | ICD-10-CM | POA: Diagnosis not present

## 2023-09-18 DIAGNOSIS — Z0000 Encounter for general adult medical examination without abnormal findings: Secondary | ICD-10-CM | POA: Diagnosis not present

## 2023-09-18 DIAGNOSIS — Z131 Encounter for screening for diabetes mellitus: Secondary | ICD-10-CM

## 2023-09-18 DIAGNOSIS — I77819 Aortic ectasia, unspecified site: Secondary | ICD-10-CM

## 2023-09-18 LAB — LIPID PANEL
Cholesterol: 203 mg/dL — ABNORMAL HIGH (ref 0–200)
HDL: 70.7 mg/dL (ref >39.00)
LDL Cholesterol: 110 mg/dL — ABNORMAL HIGH (ref 0–99)
NonHDL: 132.27
Total CHOL/HDL Ratio: 3
Triglycerides: 109 mg/dL (ref 0.0–149.0)
VLDL: 21.8 mg/dL (ref 0.0–40.0)

## 2023-09-18 LAB — COMPREHENSIVE METABOLIC PANEL WITH GFR
ALT: 19 U/L (ref 0–35)
AST: 20 U/L (ref 0–37)
Albumin: 4.3 g/dL (ref 3.5–5.2)
Alkaline Phosphatase: 53 U/L (ref 39–117)
BUN: 16 mg/dL (ref 6–23)
CO2: 31 meq/L (ref 19–32)
Calcium: 9.8 mg/dL (ref 8.4–10.5)
Chloride: 101 meq/L (ref 96–112)
Creatinine, Ser: 1.01 mg/dL (ref 0.40–1.20)
GFR: 57.56 mL/min — ABNORMAL LOW (ref >60.00)
Glucose, Bld: 98 mg/dL (ref 70–99)
Potassium: 4.9 meq/L (ref 3.5–5.1)
Sodium: 140 meq/L (ref 135–145)
Total Bilirubin: 0.7 mg/dL (ref 0.2–1.2)
Total Protein: 7.2 g/dL (ref 6.0–8.3)

## 2023-09-18 LAB — CBC
HCT: 49.2 % — ABNORMAL HIGH (ref 36.0–46.0)
Hemoglobin: 16.3 g/dL — ABNORMAL HIGH (ref 12.0–15.0)
MCHC: 33.1 g/dL (ref 30.0–36.0)
MCV: 90.7 fl (ref 78.0–100.0)
Platelets: 522 10*3/uL — ABNORMAL HIGH (ref 150.0–400.0)
RBC: 5.43 Mil/uL — ABNORMAL HIGH (ref 3.87–5.11)
RDW: 14.9 % (ref 11.5–15.5)
WBC: 6.7 10*3/uL (ref 4.0–10.5)

## 2023-09-18 LAB — HEMOGLOBIN A1C: Hgb A1c MFr Bld: 5.9 % (ref 4.6–6.5)

## 2023-09-18 LAB — TSH: TSH: 1.54 u[IU]/mL (ref 0.35–5.50)

## 2023-09-18 NOTE — Patient Instructions (Addendum)
 Return in about 1 year (around 09/18/2024) for cpe (20 min).   Purchase hydrocortisone cream and put a small amount on the outside of your ear daily to help with dryness.   Your hearing is good in both ears.      Great to see you today.  I have refilled the medication(s) we provide.   If labs were collected or images ordered, we will inform you of  results once we have received them and reviewed. We will contact you either by echart message, or telephone call.  Please give ample time to the testing facility, and our office to run,  receive and review results. Please do not call inquiring of results, even if you can see them in your chart. We will contact you as soon as we are able. If it has been over 1 week since the test was completed, and you have not yet heard from us , then please call us .    - echart message- for normal results that have been seen by the patient already.   - telephone call: abnormal results or if patient has not viewed results in their echart.  If a referral to a specialist was entered for you, please call us  in 2 weeks if you have not heard from the specialist office to schedule.

## 2023-09-18 NOTE — Progress Notes (Signed)
 Patient ID: Jaime Michael, female  DOB: [birth year], 68 y.o.   MRN: [id number] Patient Care Team    Relationship Specialty Notifications Start End  Mariel Shope, DO PCP - General Family Medicine  04/25/16   Revankar, Micael Adas, MD Consulting Physician Cardiology  05/14/18     Chief Complaint  Patient presents with   Annual Exam    Pt is fasting.     Subjective: Jaime Michael is a 68 y.o.  Female  present for CPE All past medical history, surgical history, allergies, family history, immunizations, medications and social history were updateed in the electronic medical record today. All recent labs, ED visits and hospitalizations within the last year were reviewed.  Health maintenance:  Colonoscopy: No family history, completed 12/09/2021-Dr. Gupta-10-year follow-up Mammogram: completed 10/18/2022; No family history. [facility]> ordered Immunizations: tdap UTD 2018.  Influenza vaccine encouraged yearly Shingrix completed, pneumonia 20 completed Infectious disease screening: HIV and Hep C completed  DEXA: 2018- normal-ordered today-[facility] Assistive device: reviewed Oxygen WGN:FAOZHY Patient has a Dental home. Hospitalizations/ED visits: Reviewed  Patient reports she has noticed more difficulty hearing, especially when on the phone.  She feels it may mostly be in her right ear, but is unsure.  She does have dry skin over her right ear.  Has been using olive oil application    09/18/2023    8:35 AM 10/09/2021    9:45 AM 07/10/2021    9:35 AM 10/05/2020    8:09 AM 05/14/2018    9:04 AM  Depression screen PHQ 2/9  Decreased Interest 0 0 0 0 0  Down, Depressed, Hopeless 0 0 0 0 0  PHQ - 2 Score 0 0 0 0 0  Altered sleeping 0      Tired, decreased energy 0      Change in appetite 0      Feeling bad or failure about yourself  0      Trouble concentrating 0      Moving slowly or fidgety/restless 0      Suicidal thoughts 0      PHQ-9 Score 0      Difficult doing work/chores Not  difficult at all          09/18/2023    8:35 AM 10/05/2020    8:09 AM  GAD 7 : Generalized Anxiety Score  Nervous, Anxious, on Edge 0 0  Control/stop worrying 0 1  Worry too much - different things 0 1  Trouble relaxing 0 0  Restless 0 0  Easily annoyed or irritable 0 0  Afraid - awful might happen 0 0  Total GAD 7 Score 0 2  Anxiety Difficulty Not difficult at all     Immunization History  Administered Date(s) Administered   PFIZER(Purple Top)SARS-COV-2 Vaccination 07/01/2019, 07/22/2019, 04/23/2020   PNEUMOCOCCAL CONJUGATE-20 10/09/2021   Tdap 04/25/2016   Zoster Recombinant(Shingrix) 06/21/2020, 08/29/2020   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   Chest pain of uncertain etiology 12/19/2021   Encounter for preventive health examination 04/25/2016   Family history of protein C deficiency 07/04/2021   Palpitations 01/27/2017   Pes planus of right foot 10/05/2020   Postmenopausal 05/12/2016   Postural dizziness with presyncope 06/18/2018   Protein C deficiency (HCC)    Protein deficiency (HCC) 07/17/2021   Syncope 12/05/2021   Vitamin D  deficiency 04/28/2016   No Known Allergies Past Surgical History:  Procedure Laterality Date   CESAREAN SECTION  COLONOSCOPY  2018   Dr Howard Macho   DILATION AND CURETTAGE OF UTERUS     X4   Family History  Problem Relation Age of Onset   Stomach cancer Father    Clotting disorder Brother    Clotting disorder Son    Clotting disorder Cousin        maternal side   Clotting disorder Nephew        maternal side   Colon cancer Neg Hx    Colon polyps Neg Hx    Rectal cancer Neg Hx    Social History   Social History Narrative   Married to Jaime Michael. One child   PHD/professor   Drinks caffeine, uses herbal remedies.   Wears her seatbelt. Smoke detectors in the home.   Feels safe in her relationships.    Allergies as of 09/18/2023   No Known Allergies      Medication  List        Accurate as of Sep 18, 2023 12:04 PM. If you have any questions, ask your nurse or doctor.          STOP taking these medications    ondansetron  4 MG tablet Commonly known as: Zofran  Stopped by: Napolean Backbone       TAKE these medications    Calcium Carb-Cholecalciferol 500-400 MG-UNIT Chew Chew 1 tablet by mouth 2 (two) times daily.   metoprolol  tartrate 25 MG tablet Commonly known as: LOPRESSOR  Take 0.5 tablets (12.5 mg total) by mouth 2 (two) times daily.   Vitamin D -3 5000 UNIT/ML Liqd Place 5,000 Units under the tongue daily.        All past medical history, surgical history, allergies, family history, immunizations andmedications were updated in the EMR today and reviewed under the history and medication portions of their EMR.     No results found for this or any previous visit (from the past 2160 hours).   ROS 14 pt review of systems performed and negative (unless mentioned in an HPI) Objective: BP 126/84   Pulse 65   Temp 98.4 F (36.9 C)   Ht 5' (1.524 m)   Wt 155 lb 3.2 oz (70.4 kg)   SpO2 97%   BMI 30.31 kg/m  Physical Exam Vitals and nursing note reviewed.  Constitutional:      General: She is not in acute distress.    Appearance: Normal appearance. She is not ill-appearing or toxic-appearing.  HENT:     Head: Normocephalic and atraumatic.     Comments: Dry scaly skin on external ear, normal canal    Right Ear: Tympanic membrane and ear canal normal. There is no impacted cerumen.     Left Ear: Tympanic membrane, ear canal and external ear normal. There is no impacted cerumen.     Nose: No congestion or rhinorrhea.     Mouth/Throat:     Mouth: Mucous membranes are moist.     Pharynx: Oropharynx is clear. No oropharyngeal exudate or posterior oropharyngeal erythema.  Eyes:     General: No scleral icterus.       Right eye: No discharge.        Left eye: No discharge.     Extraocular Movements: Extraocular movements intact.      Conjunctiva/sclera: Conjunctivae normal.     Pupils: Pupils are equal, round, and reactive to light.  Cardiovascular:     Rate and Rhythm: Normal rate and regular rhythm.     Pulses: Normal pulses.     Heart sounds:  Normal heart sounds. No murmur heard.    No friction rub. No gallop.  Pulmonary:     Effort: Pulmonary effort is normal. No respiratory distress.     Breath sounds: Normal breath sounds. No stridor. No wheezing, rhonchi or rales.  Chest:     Chest wall: No tenderness.  Abdominal:     General: Abdomen is flat. Bowel sounds are normal. There is no distension.     Palpations: Abdomen is soft. There is no mass.     Tenderness: There is no abdominal tenderness. There is no right CVA tenderness, left CVA tenderness, guarding or rebound.     Hernia: No hernia is present.  Musculoskeletal:        General: No swelling, tenderness or deformity. Normal range of motion.     Cervical back: Normal range of motion and neck supple. No rigidity or tenderness.     Right lower leg: No edema.     Left lower leg: No edema.  Lymphadenopathy:     Cervical: No cervical adenopathy.  Skin:    General: Skin is warm and dry.     Coloration: Skin is not jaundiced or pale.     Findings: No bruising, erythema, lesion or rash.  Neurological:     General: No focal deficit present.     Mental Status: She is alert and oriented to person, place, and time. Mental status is at baseline.     Cranial Nerves: No cranial nerve deficit.     Sensory: No sensory deficit.     Motor: No weakness.     Coordination: Coordination normal.     Gait: Gait normal.     Deep Tendon Reflexes: Reflexes normal.  Psychiatric:        Mood and Affect: Mood normal.        Behavior: Behavior normal.        Thought Content: Thought content normal.        Judgment: Judgment normal.      Hearing Screening   500Hz  1000Hz  2000Hz  3000Hz  4000Hz   Right ear 30 30 30 30 30   Left ear 30 30 30 30 30        Assessment/plan: Jaime Michael is a 68 y.o. female present for CPE  Palpitations Metoprolol  prescribed by cardio Encounter for preventive health examination Patient was encouraged to exercise greater than 150 minutes a week. Patient was encouraged to choose a diet filled with fresh fruits and vegetables, and lean meats. AVS provided to patient today for education/recommendation on gender specific health and safety maintenance. Colonoscopy: No family history, completed 12/09/2021-Dr. Gupta-10-year follow-up Mammogram: completed 10/18/2022; No family history. [facility] ordered Immunizations: tdap UTD 2018.  Influenza vaccine encouraged yearly Shingrix completed, pneumonia 20 completed Infectious disease screening: HIV and Hep C completed  DEXA: 2018- normal-ordered today-[place] [facility] - CBC - Comprehensive metabolic panel with GFR - TSH  Breast cancer screening by mammogram - MM 3D SCREENING MAMMOGRAM BILATERAL BREAST; Future Vitamin D  deficiency - DG Bone Density; Future Postmenopausal - DG Bone Density; Future Diabetes mellitus screening - Hemoglobin A1c Lipid screening - Lipid panel  Aortic dilatation (HCC) Continue yearly follow-ups with cardiology  Decreased hearing, unspecified laterality Patient was able to hear equally bilateral ears with only mild decrease present.  We discussed the possibility of background noise causing a decrease in hearing.   Return in about 1 year (around 09/18/2024) for cpe (20 min).   Orders Placed This Encounter  Procedures   MM 3D SCREENING MAMMOGRAM BILATERAL BREAST  DG Bone Density   CBC   Comprehensive metabolic panel with GFR   Hemoglobin A1c   Lipid panel   TSH   No orders of the defined types were placed in this encounter.  Referral Orders  No referral(s) requested today     Electronically signed by: Napolean Backbone, DO [facility]

## 2023-09-21 ENCOUNTER — Ambulatory Visit: Payer: Self-pay | Admitting: Family Medicine

## 2023-09-21 DIAGNOSIS — D751 Secondary polycythemia: Secondary | ICD-10-CM | POA: Insufficient documentation

## 2023-09-21 DIAGNOSIS — D6859 Other primary thrombophilia: Secondary | ICD-10-CM

## 2023-09-21 NOTE — Telephone Encounter (Signed)
 Please call patient Liver and kidney function are stable. Electrolytes and thyroid  function normal. Cholesterol levels are good and at goal. A1c/diabetes screening is within normal range.  Her red blood cells, platelets, hemoglobin and hematocrit are all high.  These have been mildly high in the past, however now they are significantly high.  This causes blood to be thicker and make her more prone to blood clots.  Considering she has protein C deficiency and family history of blood clots,I do recommend we refer her to hematology for further evaluation.  I have referred her to hematology-blood specialist to discuss abnormalities further. Recommend she consider taking baby aspirin 81 mg daily with food due to abnormalities found in her blood work today and family history of blood clot formation.

## 2023-09-23 ENCOUNTER — Ambulatory Visit

## 2023-09-28 ENCOUNTER — Encounter: Payer: Self-pay | Admitting: Family Medicine

## 2023-09-28 ENCOUNTER — Ambulatory Visit: Admitting: Family Medicine

## 2023-09-28 VITALS — BP 124/82 | HR 63 | Temp 98.3°F | Wt 153.0 lb

## 2023-09-28 DIAGNOSIS — E559 Vitamin D deficiency, unspecified: Secondary | ICD-10-CM | POA: Diagnosis not present

## 2023-09-28 DIAGNOSIS — D751 Secondary polycythemia: Secondary | ICD-10-CM

## 2023-09-28 LAB — CBC WITH DIFFERENTIAL/PLATELET
Basophils Absolute: 0.3 10*3/uL — ABNORMAL HIGH (ref 0.0–0.1)
Basophils Relative: 4.2 % — ABNORMAL HIGH (ref 0.0–3.0)
Eosinophils Absolute: 0.5 10*3/uL (ref 0.0–0.7)
Eosinophils Relative: 6.7 % — ABNORMAL HIGH (ref 0.0–5.0)
HCT: 48.2 % — ABNORMAL HIGH (ref 36.0–46.0)
Hemoglobin: 16.1 g/dL — ABNORMAL HIGH (ref 12.0–15.0)
Lymphocytes Relative: 36.3 % (ref 12.0–46.0)
Lymphs Abs: 2.6 10*3/uL (ref 0.7–4.0)
MCHC: 33.4 g/dL (ref 30.0–36.0)
MCV: 89.7 fl (ref 78.0–100.0)
Monocytes Absolute: 0.7 10*3/uL (ref 0.1–1.0)
Monocytes Relative: 9.9 % (ref 3.0–12.0)
Neutro Abs: 3 10*3/uL (ref 1.4–7.7)
Neutrophils Relative %: 42.9 % — ABNORMAL LOW (ref 43.0–77.0)
Platelets: 515 10*3/uL — ABNORMAL HIGH (ref 150.0–400.0)
RBC: 5.37 Mil/uL — ABNORMAL HIGH (ref 3.87–5.11)
RDW: 14.6 % (ref 11.5–15.5)
WBC: 7.1 10*3/uL (ref 4.0–10.5)

## 2023-09-28 NOTE — Patient Instructions (Addendum)

## 2023-09-28 NOTE — Progress Notes (Signed)
 Jaime Michael, Jaime Michael 16-Jan-1956, 68 y.o., female MRN: [id number] Patient Care Team    Relationship Specialty Notifications Start End  Mariel Shope, DO PCP - General Family Medicine  04/25/16   Revankar, Micael Adas, MD Consulting Physician Cardiology  05/14/18     Chief Complaint  Patient presents with   Discuss Lab Test Results     Subjective: Jaime Michael is a 68 y.o. Pt presents for an OV discuss abnormal lab work obtained during her yearly physical 2 weeks ago. CMP WNL, lipids at goal, thyroid  function normal, A1c 5.9, CBC with elevated hemoglobin of 16.3, hematocrit 49.2, RBCs 5.43, platelets 522.  Patient has history of probably elevated hemoglobin and hematocrit and platelets in the past, but not to this level.  She has a history of protein C deficiency and a family history of blood clots.  Patient was advised by telephone call with results of the above and hematology referral recommended, along with considering starting a baby aspirin daily.  Patient presents today with concerns over blood work.     09/18/2023    8:35 AM 10/09/2021    9:45 AM 07/10/2021    9:35 AM 10/05/2020    8:09 AM 05/14/2018    9:04 AM  Depression screen PHQ 2/9  Decreased Interest 0 0 0 0 0  Down, Depressed, Hopeless 0 0 0 0 0  PHQ - 2 Score 0 0 0 0 0  Altered sleeping 0      Tired, decreased energy 0      Change in appetite 0      Feeling bad or failure about yourself  0      Trouble concentrating 0      Moving slowly or fidgety/restless 0      Suicidal thoughts 0      PHQ-9 Score 0      Difficult doing work/chores Not difficult at all        No Known Allergies Social History   Social History Narrative   Married to [place]. One child   PHD/professor   Drinks caffeine, uses herbal remedies.   Wears her seatbelt. Smoke detectors in the home.   Feels safe in her relationships.   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   Chest pain of  uncertain etiology 12/19/2021   Encounter for preventive health examination 04/25/2016   Family history of protein C deficiency 07/04/2021   Palpitations 01/27/2017   Pes planus of right foot 10/05/2020   Postmenopausal 05/12/2016   Postural dizziness with presyncope 06/18/2018   Protein C deficiency (HCC)    Protein deficiency (HCC) 07/17/2021   Syncope 12/05/2021   Vitamin D  deficiency 04/28/2016   Past Surgical History:  Procedure Laterality Date   CESAREAN SECTION     COLONOSCOPY  2018   Dr Howard Macho   DILATION AND CURETTAGE OF UTERUS     X4   Family History  Problem Relation Age of Onset   Stomach cancer Father    Clotting disorder Brother    Clotting disorder Son    Clotting disorder Cousin        maternal side   Clotting disorder Nephew        maternal side   Colon cancer Neg Hx    Colon polyps Neg Hx    Rectal cancer Neg Hx    Allergies as of 09/28/2023   No Known Allergies      Medication  List        Accurate as of September 28, 2023  8:40 AM. If you have any questions, ask your nurse or doctor.          Calcium Carb-Cholecalciferol 500-400 MG-UNIT Chew Chew 1 tablet by mouth 2 (two) times daily.   metoprolol  tartrate 25 MG tablet Commonly known as: LOPRESSOR  Take 0.5 tablets (12.5 mg total) by mouth 2 (two) times daily.   Vitamin D -3 5000 UNIT/ML Liqd Place 5,000 Units under the tongue daily.        All past medical history, surgical history, allergies, family history, immunizations andmedications were updated in the EMR today and reviewed under the history and medication portions of their EMR.     ROS Negative, with the exception of above mentioned in HPI   Objective:  BP 124/82   Pulse 63   Temp 98.3 F (36.8 C)   Wt 153 lb (69.4 kg)   SpO2 95%   BMI 29.88 kg/m  Body mass index is 29.88 kg/m. Physical Exam Vitals and nursing note reviewed.  Constitutional:      General: She is not in acute distress.    Appearance: Normal appearance.  She is normal weight. She is not ill-appearing or toxic-appearing.  HENT:     Head: Normocephalic and atraumatic.  Eyes:     General: No scleral icterus.       Right eye: No discharge.        Left eye: No discharge.     Extraocular Movements: Extraocular movements intact.     Conjunctiva/sclera: Conjunctivae normal.     Pupils: Pupils are equal, round, and reactive to light.  Skin:    Findings: No rash.  Neurological:     Mental Status: She is alert and oriented to person, place, and time. Mental status is at baseline.     Motor: No weakness.     Coordination: Coordination normal.     Gait: Gait normal.  Psychiatric:        Mood and Affect: Mood normal.        Behavior: Behavior normal.        Thought Content: Thought content normal.        Judgment: Judgment normal.    No results found. No results found. No results found for this or any previous visit (from the past 24 hours).  Assessment/Plan: Jaime Michael is a 68 y.o. female present for OV for  Vitamin D  deficiency (Primary) - Vitamin D  (25 hydroxy)  Polycythemia Referral to heme placed last visit Hydrate Baby asa every day Questions answered - CBC w/Diff - IBC + Ferritin  Reviewed expectations re: course of current medical issues. Discussed self-management of symptoms. Outlined signs and symptoms indicating need for more acute intervention. Patient verbalized understanding and all questions were answered. Patient received an After-Visit Summary.    Orders Placed This Encounter  Procedures   CBC w/Diff   IBC + Ferritin   Vitamin D  (25 hydroxy)   No orders of the defined types were placed in this encounter.  Referral Orders  No referral(s) requested today     Note is dictated utilizing voice recognition software. Although note has been proof read prior to signing, occasional typographical errors still can be missed. If any questions arise, please do not hesitate to call for verification.    electronically signed by:  Napolean Backbone, DO  [facility] [place]

## 2023-10-01 LAB — IBC + FERRITIN
Ferritin: 54.9 ng/mL (ref 10.0–291.0)
Iron: 100 ug/dL (ref 42–145)
Saturation Ratios: 27.9 % (ref 20.0–50.0)
TIBC: 358.4 ug/dL (ref 250.0–450.0)
Transferrin: 256 mg/dL (ref 212.0–360.0)

## 2023-10-01 LAB — VITAMIN D 25 HYDROXY (VIT D DEFICIENCY, FRACTURES): VITD: 49.16 ng/mL (ref 30.00–100.00)

## 2023-10-02 ENCOUNTER — Ambulatory Visit: Payer: Self-pay | Admitting: Family Medicine

## 2023-10-06 ENCOUNTER — Ambulatory Visit: Attending: Cardiology | Admitting: Cardiology

## 2023-10-06 ENCOUNTER — Encounter: Payer: Self-pay | Admitting: Cardiology

## 2023-10-06 VITALS — BP 120/80 | HR 70 | Ht 60.0 in | Wt 152.0 lb

## 2023-10-06 DIAGNOSIS — I77819 Aortic ectasia, unspecified site: Secondary | ICD-10-CM | POA: Diagnosis not present

## 2023-10-06 DIAGNOSIS — I700 Atherosclerosis of aorta: Secondary | ICD-10-CM | POA: Insufficient documentation

## 2023-10-06 DIAGNOSIS — E782 Mixed hyperlipidemia: Secondary | ICD-10-CM | POA: Diagnosis not present

## 2023-10-06 DIAGNOSIS — R002 Palpitations: Secondary | ICD-10-CM

## 2023-10-06 MED ORDER — ROSUVASTATIN CALCIUM 10 MG PO TABS: 10.0000 mg | ORAL_TABLET | Freq: Every day | ORAL | 3 refills | Status: DC

## 2023-10-06 NOTE — Patient Instructions (Addendum)
 Medication Instructions:  Start Rosuvastatin 10 mg once a day  *If you need a refill on your cardiac medications before your next appointment, please call your pharmacy*  Lab Work: In 6 weeks we are going to need you to get fasting lipid panel, LFT's, and Bmet If you have labs (blood work) drawn today and your tests are completely normal, you will receive your results only by: MyChart Message (if you have MyChart) OR A paper copy in the mail If you have any lab test that is abnormal or we need to change your treatment, we will call you to review the results.  Testing/Procedures: We are going to get a Chest CT without contrast  Follow-Up: At [facility], you and your health needs are our priority.  As part of our continuing mission to provide you with exceptional heart care, our providers are all part of one team.  This team includes your primary Cardiologist (physician) and Advanced Practice Providers or APPs (Physician Assistants and Nurse Practitioners) who all work together to provide you with the care you need, when you need it.  Your next appointment:   1 year(s)  Provider:   Hillis Lu, MD    We recommend signing up for the patient portal called MyChart.  Sign up information is provided on this After Visit Summary.  MyChart is used to connect with patients for Virtual Visits (Telemedicine).  Patients are able to view lab/test results, encounter notes, upcoming appointments, etc.  Non-urgent messages can be sent to your provider as well.   To learn more about what you can do with MyChart, go to [email or web address].

## 2023-10-06 NOTE — Progress Notes (Signed)
 Cardiology Office Note:    Date:  10/06/2023   ID:  Anaisa Radi, DOB 23-Jan-1956, MRN [id number]  PCP:  Mariel Shope, DO  Cardiologist:  Nelia Balzarine, MD   Referring MD: Mariel Shope, DO    ASSESSMENT:    1. Palpitations   2. Aortic dilatation (HCC)- 4cm   3. Aortic atherosclerosis (HCC)   4. Mixed dyslipidemia    PLAN:    In order of problems listed above:  Primary prevention stressed with the patient.  Importance of compliance with diet medication stressed and patient verbalized standing. Patient was advised to walk at least half an hour a day on a daily basis and she promises to do so. Ascending aortic dilatation: CT scan will be done to follow-up on this.  Symptoms of dissection was discussed. Aortic atherosclerosis: Stable at this time I discussed findings. Mixed dyslipidemia: In view of aortic atherosclerosis we will initiate rosuvastatin 10 mg daily.  Diet emphasized.  Liver lipid check and Chem-7 in 6 weeks. Patient will be seen in follow-up appointment in 12 months or earlier if the patient has any concerns.    Medication Adjustments/Labs and Tests Ordered: Current medicines are reviewed at length with the patient today.  Concerns regarding medicines are outlined above.  Orders Placed This Encounter  Procedures   EKG 12-Lead   No orders of the defined types were placed in this encounter.    Chief Complaint  Patient presents with   Annual Exam     History of Present Illness:    Jaime Michael is a 68 y.o. female.  Patient has past medical history of ascending aortic dilatation, aortic atherosclerosis, mixed dyslipidemia and obesity.  Overall she leads a sedentary lifestyle.  She denies any chest pain orthopnea or PND.  At the time of my evaluation, the patient is alert awake oriented and in no distress.  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   Chest pain of uncertain etiology 12/19/2021    Encounter for preventive health examination 04/25/2016   Family history of protein C deficiency 07/04/2021   Palpitations 01/27/2017   Pes planus of right foot 10/05/2020   Postmenopausal 05/12/2016   Postural dizziness with presyncope 06/18/2018   Protein C deficiency (HCC)    Protein deficiency (HCC) 07/17/2021   Syncope 12/05/2021   Vitamin D  deficiency 04/28/2016    Past Surgical History:  Procedure Laterality Date   CESAREAN SECTION     COLONOSCOPY  2018   Dr Howard Macho   DILATION AND CURETTAGE OF UTERUS     X4    Current Medications: Current Meds  Medication Sig   Calcium Carb-Cholecalciferol 500-400 MG-UNIT CHEW Chew 1 tablet by mouth 2 (two) times daily.    Cholecalciferol (VITAMIN D -3) 5000 UNIT/ML LIQD Place 5,000 Units under the tongue daily.   metoprolol  tartrate (LOPRESSOR ) 25 MG tablet Take 0.5 tablets (12.5 mg total) by mouth 2 (two) times daily.     Allergies:   Patient has no known allergies.   Social History   Socioeconomic History   Marital status: Married    Spouse name: Ahamd   Number of children: 1   Years of education: PhD   Highest education level: Not on file  Occupational History   Occupation: Runner, broadcasting/film/video  Tobacco Use   Smoking status: Never    Passive exposure: Never   Smokeless tobacco: Never  Vaping Use   Vaping status: Never Used  Substance and Sexual Activity  Alcohol use: No   Drug use: No   Sexual activity: Never    Partners: Male    Birth control/protection: None    Comment: Married  Other Topics Concern   Not on file  Social History Narrative   Married to [place]. One child   PHD/professor   Drinks caffeine, uses herbal remedies.   Wears her seatbelt. Smoke detectors in the home.   Feels safe in her relationships.   Social Drivers of Corporate investment banker Strain: Not on file  Food Insecurity: Not on file  Transportation Needs: Not on file  Physical Activity: Not on file  Stress: Not on file  Social Connections: Not  on file     Family History: The patient's family history includes Clotting disorder in her brother, cousin, nephew, and son; Stomach cancer in her father. There is no history of Colon cancer, Colon polyps, or Rectal cancer.  ROS:   Please see the history of present illness.    All other systems reviewed and are negative.  EKGs/Labs/Other Studies Reviewed:    The following studies were reviewed today: I discussed my findings with the patient at length   Recent Labs: 09/18/2023: ALT 19; BUN 16; Creatinine, Ser 1.01; Potassium 4.9; Sodium 140; TSH 1.54 09/28/2023: Hemoglobin 16.1; Platelets 515.0  Recent Lipid Panel    Component Value Date/Time   CHOL 203 (H) 09/18/2023 0835   TRIG 109.0 09/18/2023 0835   HDL 70.70 09/18/2023 0835   CHOLHDL 3 09/18/2023 0835   VLDL 21.8 09/18/2023 0835   LDLCALC 110 (H) 09/18/2023 0835    Physical Exam:    VS:  BP 120/80   Pulse 70   Ht 5' (1.524 m)   Wt 152 lb (68.9 kg)   SpO2 94%   BMI 29.69 kg/m     Wt Readings from Last 3 Encounters:  10/06/23 152 lb (68.9 kg)  09/28/23 153 lb (69.4 kg)  09/18/23 155 lb 3.2 oz (70.4 kg)     GEN: Patient is in no acute distress HEENT: Normal NECK: No JVD; No carotid bruits LYMPHATICS: No lymphadenopathy CARDIAC: Hear sounds regular, 2/6 systolic murmur at the apex. RESPIRATORY:  Clear to auscultation without rales, wheezing or rhonchi  ABDOMEN: Soft, non-tender, non-distended MUSCULOSKELETAL:  No edema; No deformity  SKIN: Warm and dry NEUROLOGIC:  Alert and oriented x 3 PSYCHIATRIC:  Normal affect   Signed, Nelia Balzarine, MD  10/06/2023 8:18 AM    [facility]

## 2023-10-12 ENCOUNTER — Ambulatory Visit (HOSPITAL_BASED_OUTPATIENT_CLINIC_OR_DEPARTMENT_OTHER)
Admission: RE | Admit: 2023-10-12 | Discharge: 2023-10-12 | Disposition: A | Discharge status: Home / Self Care | Source: Ambulatory Visit | Attending: Cardiology | Admitting: Cardiology

## 2023-10-12 DIAGNOSIS — I700 Atherosclerosis of aorta: Secondary | ICD-10-CM | POA: Insufficient documentation

## 2023-10-12 DIAGNOSIS — I77819 Aortic ectasia, unspecified site: Secondary | ICD-10-CM | POA: Diagnosis not present

## 2023-10-13 ENCOUNTER — Other Ambulatory Visit

## 2023-10-13 ENCOUNTER — Other Ambulatory Visit: Payer: Self-pay | Admitting: Cardiology

## 2023-10-13 DIAGNOSIS — I700 Atherosclerosis of aorta: Secondary | ICD-10-CM | POA: Diagnosis not present

## 2023-10-13 DIAGNOSIS — E782 Mixed hyperlipidemia: Secondary | ICD-10-CM | POA: Diagnosis not present

## 2023-10-13 DIAGNOSIS — R079 Chest pain, unspecified: Secondary | ICD-10-CM

## 2023-10-13 DIAGNOSIS — I77819 Aortic ectasia, unspecified site: Secondary | ICD-10-CM | POA: Diagnosis not present

## 2023-10-13 DIAGNOSIS — R002 Palpitations: Secondary | ICD-10-CM

## 2023-10-14 ENCOUNTER — Other Ambulatory Visit: Payer: Self-pay | Admitting: Hematology and Oncology

## 2023-10-14 ENCOUNTER — Ambulatory Visit: Payer: Self-pay | Admitting: Cardiology

## 2023-10-14 DIAGNOSIS — D75839 Thrombocytosis, unspecified: Secondary | ICD-10-CM

## 2023-10-14 LAB — HEPATIC FUNCTION PANEL
ALT: 18 IU/L (ref 0–32)
AST: 21 IU/L (ref 0–40)
Albumin: 4.2 g/dL (ref 3.9–4.9)
Alkaline Phosphatase: 54 IU/L (ref 44–121)
Bilirubin Total: 0.4 mg/dL (ref 0.0–1.2)
Bilirubin, Direct: 0.16 mg/dL (ref 0.00–0.40)
Total Protein: 6.8 g/dL (ref 6.0–8.5)

## 2023-10-14 LAB — BASIC METABOLIC PANEL WITH GFR
BUN/Creatinine Ratio: 12 (ref 12–28)
BUN: 12 mg/dL (ref 8–27)
CO2: 24 mmol/L (ref 20–29)
Calcium: 9.9 mg/dL (ref 8.7–10.3)
Chloride: 102 mmol/L (ref 96–106)
Creatinine, Ser: 1.03 mg/dL — ABNORMAL HIGH (ref 0.57–1.00)
Glucose: 103 mg/dL — ABNORMAL HIGH (ref 70–99)
Potassium: 5.1 mmol/L (ref 3.5–5.2)
Sodium: 140 mmol/L (ref 134–144)
eGFR: 60 mL/min/{1.73_m2} (ref >59)

## 2023-10-14 LAB — LIPID PANEL
Chol/HDL Ratio: 2.1 ratio (ref 0.0–4.4)
Cholesterol, Total: 144 mg/dL (ref 100–199)
HDL: 68 mg/dL (ref >39)
LDL Chol Calc (NIH): 61 mg/dL (ref 0–99)
Triglycerides: 75 mg/dL (ref 0–149)
VLDL Cholesterol Cal: 15 mg/dL (ref 5–40)

## 2023-10-14 NOTE — Progress Notes (Unsigned)
 [facility] [phone]   [phone]  PROGRESS NOTE  Patient Care Team: Catherine Charlies LABOR, DO as PCP - General (Family Medicine) Revankar, Jennifer SAUNDERS, MD as Consulting Physician (Cardiology)  Hematological/Oncological History # Polycythemia/Thrombocytosis 08/12/2021: last visit with Dr. Federico 09/28/2023: White blood cell 7.1, hemoglobin 16.1, MCV 89.7, platelets 515   # Decreased Levels of Protein C  07/10/2021: Protein C antigen 36%, protein C activity 30% 08/12/2021: establish care with Dr. Federico   Interval History:  Jaime Michael 68 y.o. female with medical history significant for low levels of protein C who presents for a follow up visit. The patient's last visit was on 08/12/2021. In the interim since the last visit she was rereferred due to concern for polycythemia and thrombocytosis.  On exam today Jaime Michael reports that she has recently started aspirin 81 mg p.o. daily but no other new medications.  She is not having any issues with itching and has had no headache or vision changes.  She denies any shortness of breath or cough.  She has no signs or symptoms concerning for VTE.  She reports no inflammatory issues such as joint pain or rash.  She reports that her energy levels overall have been good she is not having any trouble with daytime sleepiness.  She does report her family is concerned about her heavy snoring.  She is otherwise is not having any bleeding, bruising, or dark stools.  A full 10 point ROS is otherwise negative.  MEDICAL HISTORY:  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   Chest pain of uncertain etiology 12/19/2021   Encounter for preventive health examination 04/25/2016   Family history of protein C deficiency 07/04/2021   Palpitations 01/27/2017   Pes planus of right foot 10/05/2020   Postmenopausal 05/12/2016   Postural dizziness with presyncope 06/18/2018   Protein C  deficiency (HCC)    Protein deficiency (HCC) 07/17/2021   Syncope 12/05/2021   Vitamin D  deficiency 04/28/2016    SURGICAL HISTORY: Past Surgical History:  Procedure Laterality Date   CESAREAN SECTION     COLONOSCOPY  2018   Dr Teressa   DILATION AND CURETTAGE OF UTERUS     X4    SOCIAL HISTORY: Social History   Socioeconomic History   Marital status: Married    Spouse name: Ahamd   Number of children: 1   Years of education: PhD   Highest education level: Not on file  Occupational History   Occupation: Runner, broadcasting/film/video  Tobacco Use   Smoking status: Never    Passive exposure: Never   Smokeless tobacco: Never  Vaping Use   Vaping status: Never Used  Substance and Sexual Activity   Alcohol use: No   Drug use: No   Sexual activity: Never    Partners: Male    Birth control/protection: None    Comment: Married  Other Topics Concern   Not on file  Social History Narrative   Married to [place]. One child   PHD/professor   Drinks caffeine, uses herbal remedies.   Wears her seatbelt. Smoke detectors in the home.   Feels safe in her relationships.   Social Drivers of Corporate investment banker Strain: Not on file  Food Insecurity: Not on file  Transportation Needs: Not on file  Physical Activity: Not on file  Stress: Not on file  Social Connections: Not on file  Intimate Partner Violence: Not on file    FAMILY  HISTORY: Family History  Problem Relation Age of Onset   Stomach cancer Father    Clotting disorder Brother    Clotting disorder Son    Clotting disorder Cousin        maternal side   Clotting disorder Nephew        maternal side   Colon cancer Neg Hx    Colon polyps Neg Hx    Rectal cancer Neg Hx     ALLERGIES:  has no known allergies.  MEDICATIONS:  Current Outpatient Medications  Medication Sig Dispense Refill   Calcium Carb-Cholecalciferol 500-400 MG-UNIT CHEW Chew 1 tablet by mouth 2 (two) times daily.      Cholecalciferol (VITAMIN D -3) 5000  UNIT/ML LIQD Place 5,000 Units under the tongue daily.     metoprolol  tartrate (LOPRESSOR ) 25 MG tablet Take 0.5 tablets (12.5 mg total) by mouth 2 (two) times daily. 90 tablet 3   rosuvastatin (CRESTOR) 10 MG tablet Take 1 tablet (10 mg total) by mouth daily. 90 tablet 3   No current facility-administered medications for this visit.    REVIEW OF SYSTEMS:   Constitutional: ( - ) fevers, ( - )  chills , ( - ) night sweats Eyes: ( - ) blurriness of vision, ( - ) double vision, ( - ) watery eyes Ears, nose, mouth, throat, and face: ( - ) mucositis, ( - ) sore throat Respiratory: ( - ) cough, ( - ) dyspnea, ( - ) wheezes Cardiovascular: ( - ) palpitation, ( - ) chest discomfort, ( - ) lower extremity swelling Gastrointestinal:  ( - ) nausea, ( - ) heartburn, ( - ) change in bowel habits Skin: ( - ) abnormal skin rashes Lymphatics: ( - ) new lymphadenopathy, ( - ) easy bruising Neurological: ( - ) numbness, ( - ) tingling, ( - ) new weaknesses Behavioral/Psych: ( - ) mood change, ( - ) new changes  All other systems were reviewed with the patient and are negative.  PHYSICAL EXAMINATION:  Vitals:   10/15/23 0815  BP: 136/78  Pulse: 64  Resp: 15  Temp: 97.8 F (36.6 C)  SpO2: 98%   Filed Weights   10/15/23 0815  Weight: 152 lb 3.2 oz (69 kg)    GENERAL: Well-appearing elderly female, alert, no distress and comfortable SKIN: skin color, texture, turgor are normal, no rashes or significant lesions EYES: conjunctiva are pink and non-injected, sclera clear LUNGS: clear to auscultation and percussion with normal breathing effort HEART: regular rate & rhythm and no murmurs and no lower extremity edema Musculoskeletal: no cyanosis of digits and no clubbing  PSYCH: alert & oriented x 3, fluent speech NEURO: no focal motor/sensory deficits  LABORATORY DATA:  I have reviewed the data as listed    Latest Ref Rng & Units 10/15/2023    7:50 AM 09/28/2023    8:37 AM 09/18/2023    8:35 AM   CBC  WBC 4.0 - 10.5 K/uL 7.5  7.1  6.7   Hemoglobin 12.0 - 15.0 g/dL 83.1  83.8  83.6   Hematocrit 36.0 - 46.0 % 49.6  48.2  49.2   Platelets 150 - 400 K/uL 556  515.0  522.0        Latest Ref Rng & Units 10/15/2023    7:50 AM 10/13/2023    8:07 AM 09/18/2023    8:35 AM  CMP  Glucose 70 - 99 mg/dL 888  896  98   BUN 8 - 23 mg/dL 12  12  16   Creatinine 0.44 - 1.00 mg/dL 8.97  8.96  8.98   Sodium 135 - 145 mmol/L 142  140  140   Potassium 3.5 - 5.1 mmol/L 4.1  5.1  4.9   Chloride 98 - 111 mmol/L 108  102  101   CO2 22 - 32 mmol/L 27  24  31    Calcium 8.9 - 10.3 mg/dL 9.5  9.9  9.8   Total Protein 6.5 - 8.1 g/dL 7.4  6.8  7.2   Total Bilirubin 0.0 - 1.2 mg/dL 0.7  0.4  0.7   Alkaline Phos 38 - 126 U/L 45  54  53   AST 15 - 41 U/L 18  21  20    ALT 0 - 44 U/L 16  18  19      RADIOGRAPHIC STUDIES: No results found.  ASSESSMENT & PLAN Jaime Michael 68 y.o. female with medical history significant for low levels of protein C who presents for a follow up visit.  There are two types of thrombocytosis, essential thrombocytosis and secondary thrombocytosis. Essential thrombocytosis is overproduction of platelets due to a driver mutation. The most common mutation is the JAK2 [id number] (50% of cases), but other causes include CALR, MPL, and mutations of unclear significance on NGS. Essential thrombocytosis is a myeloproliferative neoplasm which may require cytoreductive therapy do decrease risk of thrombosis. Secondary thrombocytosis can be caused by low iron levels, increased inflammation,  or splenectomy.  Our workup will focus on determining if there is a secondary cause of this patient's thrombocytosis.    # Polycythemia  # Thrombocytosis --workup to include CBC, CMP and iron panel/ferritin.  Also ordered erythropoietin level --patient has no history of splenectomy     --Continue aspirin 81 mg p.o. daily for thromboprophylaxis. --will order MPN workup to include JAK2 with reflex and BCR/ABL  FISH  --Patient has significant snoring, concern for possible struct of sleep apnea.  Will put in request for sleep study today. --RTC pending the results of the above labs.  Placeholder visit in 3 months time.  Orders Placed This Encounter  Procedures   Ambulatory referral to Neurology    Referral Priority:   Routine    Referral Type:   Consultation    Referral Reason:   Specialty Services Required    Referred to Provider:   Chalice Saunas, MD    Requested Specialty:   Neurology    Number of Visits Requested:   1    All questions were answered. The patient knows to call the clinic with any problems, questions or concerns.  A total of more than 40 minutes were spent on this encounter with face-to-face time and non-face-to-face time, including preparing to see the patient, ordering tests and/or medications, counseling the patient and coordination of care as outlined above.   Norleen IVAR Kidney, MD Department of Hematology/Oncology [facility] at [facility] Phone: [phone] Pager: [phone] Email: [email or web address]  10/15/2023 9:02 AM

## 2023-10-15 ENCOUNTER — Inpatient Hospital Stay: Admitting: Hematology and Oncology

## 2023-10-15 ENCOUNTER — Inpatient Hospital Stay: Attending: Hematology and Oncology

## 2023-10-15 VITALS — BP 136/78 | HR 64 | Temp 97.8°F | Resp 15 | Wt 152.2 lb

## 2023-10-15 DIAGNOSIS — D751 Secondary polycythemia: Secondary | ICD-10-CM | POA: Insufficient documentation

## 2023-10-15 DIAGNOSIS — D473 Essential (hemorrhagic) thrombocythemia: Secondary | ICD-10-CM | POA: Diagnosis not present

## 2023-10-15 DIAGNOSIS — D75839 Thrombocytosis, unspecified: Secondary | ICD-10-CM | POA: Insufficient documentation

## 2023-10-15 DIAGNOSIS — R0683 Snoring: Secondary | ICD-10-CM | POA: Diagnosis not present

## 2023-10-15 DIAGNOSIS — E46 Unspecified protein-calorie malnutrition: Secondary | ICD-10-CM | POA: Diagnosis not present

## 2023-10-15 DIAGNOSIS — Z79899 Other long term (current) drug therapy: Secondary | ICD-10-CM | POA: Diagnosis not present

## 2023-10-15 DIAGNOSIS — Z7982 Long term (current) use of aspirin: Secondary | ICD-10-CM | POA: Diagnosis not present

## 2023-10-15 DIAGNOSIS — Z800 Family history of malignant neoplasm of digestive organs: Secondary | ICD-10-CM | POA: Insufficient documentation

## 2023-10-15 DIAGNOSIS — E559 Vitamin D deficiency, unspecified: Secondary | ICD-10-CM | POA: Insufficient documentation

## 2023-10-15 LAB — CBC WITH DIFFERENTIAL (CANCER CENTER ONLY)
Abs Immature Granulocytes: 0.01 10*3/uL (ref 0.00–0.07)
Basophils Absolute: 0.2 10*3/uL — ABNORMAL HIGH (ref 0.0–0.1)
Basophils Relative: 2 %
Eosinophils Absolute: 0.4 10*3/uL (ref 0.0–0.5)
Eosinophils Relative: 6 %
HCT: 49.6 % — ABNORMAL HIGH (ref 36.0–46.0)
Hemoglobin: 16.8 g/dL — ABNORMAL HIGH (ref 12.0–15.0)
Immature Granulocytes: 0 %
Lymphocytes Relative: 30 %
Lymphs Abs: 2.3 10*3/uL (ref 0.7–4.0)
MCH: 29.9 pg (ref 26.0–34.0)
MCHC: 33.9 g/dL (ref 30.0–36.0)
MCV: 88.3 fL (ref 80.0–100.0)
Monocytes Absolute: 0.6 10*3/uL (ref 0.1–1.0)
Monocytes Relative: 8 %
Neutro Abs: 4.1 10*3/uL (ref 1.7–7.7)
Neutrophils Relative %: 54 %
Platelet Count: 556 10*3/uL — ABNORMAL HIGH (ref 150–400)
RBC: 5.62 MIL/uL — ABNORMAL HIGH (ref 3.87–5.11)
RDW: 14 % (ref 11.5–15.5)
WBC Count: 7.5 10*3/uL (ref 4.0–10.5)
nRBC: 0 % (ref 0.0–0.2)

## 2023-10-15 LAB — CMP (CANCER CENTER ONLY)
ALT: 16 U/L (ref 0–44)
AST: 18 U/L (ref 15–41)
Albumin: 4.2 g/dL (ref 3.5–5.0)
Alkaline Phosphatase: 45 U/L (ref 38–126)
Anion gap: 7 (ref 5–15)
BUN: 12 mg/dL (ref 8–23)
CO2: 27 mmol/L (ref 22–32)
Calcium: 9.5 mg/dL (ref 8.9–10.3)
Chloride: 108 mmol/L (ref 98–111)
Creatinine: 1.02 mg/dL — ABNORMAL HIGH (ref 0.44–1.00)
GFR, Estimated: >60 mL/min (ref >60)
Glucose, Bld: 111 mg/dL — ABNORMAL HIGH (ref 70–99)
Potassium: 4.1 mmol/L (ref 3.5–5.1)
Sodium: 142 mmol/L (ref 135–145)
Total Bilirubin: 0.7 mg/dL (ref 0.0–1.2)
Total Protein: 7.4 g/dL (ref 6.5–8.1)

## 2023-10-15 LAB — RETIC PANEL
Immature Retic Fract: 11.9 % (ref 2.3–15.9)
RBC.: 5.61 MIL/uL — ABNORMAL HIGH (ref 3.87–5.11)
Retic Count, Absolute: 78.5 10*3/uL (ref 19.0–186.0)
Retic Ct Pct: 1.4 % (ref 0.4–3.1)
Reticulocyte Hemoglobin: 33.3 pg (ref >27.9)

## 2023-10-15 LAB — IRON AND IRON BINDING CAPACITY (CC-WL,HP ONLY)
Iron: 125 ug/dL (ref 28–170)
Saturation Ratios: 36 % — ABNORMAL HIGH (ref 10.4–31.8)
TIBC: 347 ug/dL (ref 250–450)
UIBC: 222 ug/dL (ref 148–442)

## 2023-10-15 LAB — FERRITIN: Ferritin: 81 ng/mL (ref 11–307)

## 2023-10-16 LAB — ERYTHROPOIETIN: Erythropoietin: 2.9 m[IU]/mL (ref 2.6–18.5)

## 2023-10-18 DIAGNOSIS — R519 Headache, unspecified: Secondary | ICD-10-CM | POA: Diagnosis not present

## 2023-10-18 DIAGNOSIS — R410 Disorientation, unspecified: Secondary | ICD-10-CM | POA: Diagnosis not present

## 2023-10-18 DIAGNOSIS — R202 Paresthesia of skin: Secondary | ICD-10-CM | POA: Diagnosis not present

## 2023-10-18 DIAGNOSIS — E785 Hyperlipidemia, unspecified: Secondary | ICD-10-CM | POA: Diagnosis not present

## 2023-10-18 DIAGNOSIS — R42 Dizziness and giddiness: Secondary | ICD-10-CM | POA: Diagnosis not present

## 2023-10-18 DIAGNOSIS — R200 Anesthesia of skin: Secondary | ICD-10-CM | POA: Diagnosis not present

## 2023-10-18 DIAGNOSIS — D751 Secondary polycythemia: Secondary | ICD-10-CM | POA: Diagnosis not present

## 2023-10-18 DIAGNOSIS — I719 Aortic aneurysm of unspecified site, without rupture: Secondary | ICD-10-CM | POA: Diagnosis not present

## 2023-10-21 ENCOUNTER — Ambulatory Visit

## 2023-10-21 DIAGNOSIS — Z780 Asymptomatic menopausal state: Secondary | ICD-10-CM

## 2023-10-21 DIAGNOSIS — E559 Vitamin D deficiency, unspecified: Secondary | ICD-10-CM

## 2023-10-22 ENCOUNTER — Ambulatory Visit: Payer: Self-pay | Admitting: Cardiology

## 2023-10-22 DIAGNOSIS — I77819 Aortic ectasia, unspecified site: Secondary | ICD-10-CM

## 2023-10-24 LAB — BCR-ABL1 FISH
Cells Analyzed: 200
Cells Counted: 200

## 2023-10-25 LAB — JAK2 V617F RFX CALR/MPL/E12-15: JAK2 V617F %: 12.51 %

## 2023-10-26 ENCOUNTER — Encounter: Payer: Self-pay | Admitting: Family Medicine

## 2023-11-03 NOTE — Telephone Encounter (Signed)
 Pt scheduled for 7/21

## 2023-11-03 NOTE — Telephone Encounter (Signed)
 Please call pt and schedule her for an appt to discuss the incidental kidney findings on her CT, which was ordered by her cardiology team. Will discuss next steps/imagings and place orders if appropriate during that visit

## 2023-11-09 ENCOUNTER — Encounter: Payer: Self-pay | Admitting: Family Medicine

## 2023-11-09 ENCOUNTER — Ambulatory Visit: Admitting: Family Medicine

## 2023-11-09 VITALS — BP 127/73 | HR 62 | Temp 98.2°F | Wt 150.0 lb

## 2023-11-09 DIAGNOSIS — N289 Disorder of kidney and ureter, unspecified: Secondary | ICD-10-CM | POA: Diagnosis not present

## 2023-11-09 DIAGNOSIS — L989 Disorder of the skin and subcutaneous tissue, unspecified: Secondary | ICD-10-CM | POA: Insufficient documentation

## 2023-11-09 NOTE — Patient Instructions (Signed)

## 2023-11-09 NOTE — Progress Notes (Signed)
 Jaime Michael, Curet Jun 12, 1955, 68 y.o., female MRN: [id number] Patient Care Team    Relationship Specialty Notifications Start End  Catherine Charlies LABOR, DO PCP - General Family Medicine  04/25/16   Revankar, Jennifer SAUNDERS, MD Consulting Physician Cardiology  05/14/18     Chief Complaint  Patient presents with   Imaging Review      Subjective: Jaime Michael is a 68 y.o. Pt presents for an OV to discuss her abnormal incidental findings of bilateral renal lesions.  Renal lesions were noted on CT chest completed by cardiology.  Full visualization of renal lesions were not obtained.  Bilobed 4.4 cm fluid attenuation right upper pole and 5.5 cm low-attenuation left upper pole.  A CT chest completed little over a year ago noted a right renal cyst, no follow-up was necessary at that time.  Patient denies any family history of RCC or polycystic kidney disease She is a non-smoker.   She denies fevers, chills, urinary discomfort, hematuria, unintentional weight loss or night sweats.  She denies low back pain.   CT chest 10/12/2023 FINDINGS: Cardiovascular: Heart size normal. No pericardial effusion. Central great vessels grossly unremarkable. No convincing TAA on this uninfused study. Minimal calcified plaque in the aortic arch and descending thoracic aorta.   Mediastinum/Nodes: No hematoma, mass, or adenopathy.   Lungs/Pleura: No pleural effusion.  No pneumothorax.  Lungs clear.   Upper Abdomen: Bilobed 4.4 cm near fluid-attenuation right upper pole renal process. 5.5 cm low-attenuation left upper pole renal process, incompletely visualized. No acute findings.   Musculoskeletal: Vertebral endplate spurring at multiple levels in the mid and lower thoracic spine.   IMPRESSION: 1. No acute findings. 2. Bilateral renal lesions possibly cysts but incompletely characterized. Ultrasound or renal MR would provide more definitive characterization. 3.  Aortic Atherosclerosis (ICD10-I70.0).   Patient  also has noted a skin lesion on her right anterior thigh.  She states she thought it was not things that she never discussed, but now that she is having issues with her kidneys she is concerned. She reports the skin lesion has been present for a little over a year.  She will pick off the entire lesion which is raised about 1 cm.  She reports it will bleed a great deal, but then grows quickly back to the same presentation.     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   Wears her seatbelt. Smoke detectors in the home.   Feels safe in her relationships.  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   Chest pain of uncertain etiology 12/19/2021   Encounter for preventive health examination 04/25/2016   Family history of protein C deficiency 07/04/2021   Palpitations 01/27/2017   Pes planus of right foot 10/05/2020   Postmenopausal 05/12/2016   Postural dizziness with presyncope 06/18/2018   Protein C deficiency (HCC)    Protein deficiency (HCC) 07/17/2021   Syncope 12/05/2021   Vitamin D  deficiency 04/28/2016   Past Surgical History:  Procedure Laterality Date   CESAREAN SECTION     COLONOSCOPY  2018   Dr Teressa   DILATION AND CURETTAGE OF UTERUS     X4   Family History  Problem Relation Age of  Onset   Stomach cancer Father    Clotting disorder Brother    Clotting disorder Son    Clotting disorder Cousin        maternal side   Clotting disorder Nephew        maternal side   Colon cancer Neg Hx    Colon polyps Neg Hx    Rectal cancer Neg Hx    Allergies as of 11/09/2023   No Known Allergies      Medication List        Accurate as of November 09, 2023 12:03 PM. If you have any questions, ask your nurse or doctor.          Calcium  Carb-Cholecalciferol 500-400 MG-UNIT Chew Chew 1 tablet by mouth 2 (two) times daily.   metoprolol  tartrate 25 MG tablet Commonly known as: LOPRESSOR  TAKE 1/2 TABLET(12.5 MG) BY MOUTH TWICE DAILY   rosuvastatin  10 MG tablet Commonly known as: CRESTOR  Take 1 tablet (10 mg total) by mouth daily.   Vitamin D -3 5000 UNIT/ML Liqd Place 5,000 Units under the tongue daily.        All past medical history, surgical history, allergies, family history, immunizations andmedications were updated in the EMR today and reviewed under the history and medication portions of their EMR.     ROS Negative, with the exception of above mentioned in HPI   Objective:  BP 127/73   Pulse 62   Temp 98.2 F (36.8 C)   Wt 150 lb (68 kg)   SpO2 96%   BMI 29.29 kg/m  Body mass index is 29.29 kg/m.  Physical Exam Vitals and nursing note reviewed.  Constitutional:      General: She is not in acute distress.    Appearance: Normal appearance. She is not ill-appearing, toxic-appearing or diaphoretic.  HENT:     Head: Normocephalic and atraumatic.  Eyes:     General: No scleral icterus.       Right eye: No discharge.        Left eye: No discharge.     Extraocular Movements: Extraocular movements intact.     Conjunctiva/sclera: Conjunctivae normal.     Pupils: Pupils are equal, round, and reactive to light.  Cardiovascular:     Rate and Rhythm: Normal rate and regular rhythm.  Pulmonary:     Effort: Pulmonary effort is normal. No respiratory  distress.     Breath sounds: Normal breath sounds. No wheezing, rhonchi or rales.  Abdominal:     General: Abdomen is flat. Bowel sounds are normal. There is no distension.     Palpations: Abdomen is soft. There is no mass.     Tenderness: There is no right CVA tenderness, left CVA tenderness, guarding  or rebound.  Musculoskeletal:     Right lower leg: No edema.     Left lower leg: No edema.  Skin:    General: Skin is warm.     Findings: Lesion (1 cm x 0.8 cm round raised horned skin lesion.) present. No rash.  Neurological:     Mental Status: She is alert and oriented to person, place, and time. Mental status is at baseline.     Motor: No weakness.     Gait: Gait normal.  Psychiatric:        Mood and Affect: Mood normal.        Behavior: Behavior normal.        Thought Content: Thought content normal.        Judgment: Judgment normal.      No results found. No results found. No results found for this or any previous visit (from the past 24 hours).  Assessment/Plan: Bridey Brookover is a 68 y.o. female present for OV for  Renal lesion b/l kidneys-Bilobed 4.4 cm near fluid-attenuation right upper pole & 5.5 cm low-attenuation left upper pole renal process (Primary) Reviewed CT chest, which only partially visualizes b/ renal lesions.  Recent urine and CMP normal.  - US  Renal; Future- STAT [place] lx (2nd option [facility]) Once results received will discuss referral t uro/nephro if needed.   Skin lesion of left leg Concern for SCC. Pt reports has been present over 1 yr..  - Ambulatory referral to Dermatology  Reviewed expectations re: course of current medical issues. Discussed self-management of symptoms. Outlined signs and symptoms indicating need for more acute intervention. Patient verbalized understanding and all questions were answered. Patient received an After-Visit Summary.    Orders Placed This Encounter  Procedures   US  Renal   Ambulatory referral to Dermatology   No  orders of the defined types were placed in this encounter.  Referral Orders         Ambulatory referral to Dermatology       Note is dictated utilizing voice recognition software. Although note has been proof read prior to signing, occasional typographical errors still can be missed. If any questions arise, please do not hesitate to call for verification.   electronically signed by:  Charlies Bellini, DO  [facility] [place]

## 2023-11-10 ENCOUNTER — Other Ambulatory Visit

## 2023-11-10 DIAGNOSIS — N289 Disorder of kidney and ureter, unspecified: Secondary | ICD-10-CM

## 2023-11-10 DIAGNOSIS — N281 Cyst of kidney, acquired: Secondary | ICD-10-CM | POA: Diagnosis not present

## 2023-11-11 ENCOUNTER — Encounter: Payer: Self-pay | Admitting: Physician Assistant

## 2023-11-11 ENCOUNTER — Ambulatory Visit: Payer: Self-pay | Admitting: Family Medicine

## 2023-11-11 ENCOUNTER — Ambulatory Visit: Admitting: Physician Assistant

## 2023-11-11 VITALS — BP 109/71 | HR 67

## 2023-11-11 DIAGNOSIS — Q828 Other specified congenital malformations of skin: Secondary | ICD-10-CM | POA: Diagnosis not present

## 2023-11-11 DIAGNOSIS — D485 Neoplasm of uncertain behavior of skin: Secondary | ICD-10-CM

## 2023-11-11 DIAGNOSIS — L110 Acquired keratosis follicularis: Secondary | ICD-10-CM | POA: Diagnosis not present

## 2023-11-11 NOTE — Patient Instructions (Signed)

## 2023-11-11 NOTE — Progress Notes (Signed)
   New Patient Visit   Subjective  Jaime Michael is a 68 y.o. female who presents for the following: Spot of concern on her left inner upper thigh.    No hx or family hx of skin cancer. It has been there for about a year. It peels and bleeds but always come back.  The following portions of the chart were reviewed this encounter and updated as appropriate: medications, allergies, medical history  Review of Systems:  No other skin or systemic complaints except as noted in HPI or Assessment and Plan.  Objective  Well appearing patient in no apparent distress; mood and affect are within normal limits.    A focused examination was performed of the following areas: Right upper thigh  Relevant exam findings are noted in the Assessment and Plan.       Left Thigh - Anterior Cutaneous horn  Assessment & Plan     NEOPLASM OF UNCERTAIN BEHAVIOR OF SKIN Left Thigh - Anterior Skin / nail biopsy Type of biopsy: tangential   Informed consent: discussed and consent obtained   Timeout: patient name, date of birth, surgical site, and procedure verified   Procedure prep:  Patient was prepped and draped in usual sterile fashion Prep type:  Isopropyl alcohol Anesthesia: the lesion was anesthetized in a standard fashion   Anesthetic:  1% lidocaine w/ epinephrine 1-100,000 buffered w/ 8.4% NaHCO3 Instrument used: DermaBlade   Hemostasis achieved with: aluminum chloride   Outcome: patient tolerated procedure well   Post-procedure details: sterile dressing applied and wound care instructions given   Dressing type: petrolatum gauze and bandage   Additional details:  8mm  Specimen 1 - Surgical pathology Differential Diagnosis: r/o wart vs ISK vs scc  Check Margins: No  No follow-ups on file.  I, Berwyn Lesches, Surg Tech III, am acting as scribe for Keandre Linden K, PA-C.   Documentation: I have reviewed the above documentation for accuracy and completeness, and I agree with the  above.  Hades Mathew K, PA-C

## 2023-11-12 LAB — SURGICAL PATHOLOGY

## 2023-11-14 ENCOUNTER — Ambulatory Visit: Payer: Self-pay | Admitting: Physician Assistant

## 2023-11-17 ENCOUNTER — Ambulatory Visit (INDEPENDENT_AMBULATORY_CARE_PROVIDER_SITE_OTHER): Admitting: Neurology

## 2023-11-17 ENCOUNTER — Encounter: Payer: Self-pay | Admitting: Neurology

## 2023-11-17 VITALS — BP 124/73 | HR 64 | Ht 60.0 in | Wt 151.0 lb

## 2023-11-17 DIAGNOSIS — D751 Secondary polycythemia: Secondary | ICD-10-CM

## 2023-11-17 DIAGNOSIS — R0683 Snoring: Secondary | ICD-10-CM | POA: Diagnosis not present

## 2023-11-17 NOTE — Patient Instructions (Signed)
Hypoxia Hypoxia is a condition that happens when there is a lack of oxygen in the body's tissues and organs. When there is not enough oxygen, organs cannot work as they should. This causes serious problems throughout the body and in the brain. What are the causes? This condition may be caused by: Exposure to high altitude. A collapsed lung (pneumothorax). Lung infection (pneumonia). Lung injury. Long-term (chronic) lung disease, such as chronic obstructive pulmonary disease (COPD) or emphysema. Fluid collecting in the chest cavity (congestive heart failure), or blood collecting in the chest cavity (hemothorax). Food, saliva, or vomit getting into the airway (aspiration). Reduced blood flow (ischemia). Severe blood loss. Slow or shallow breathing (hypoventilation). Blood disorders, such as anemia. Carbon monoxide or cyanide poisoning. The heart suddenly stopping (cardiac arrest). Medicines or recreational drugs with severe sedating effects. Drowning. Choking. What are the signs or symptoms? Symptoms of this condition include: Headache. Feeling tired (fatigue). Forgetfulness. Nausea. Confusion. Shortness of breath. Dizziness. Bluish color of the skin, lips, or nail beds (cyanosis). Change in consciousness or awareness. If hypoxia is not treated, it can lead to convulsions, loss of consciousness (coma), or brain damage, which can be life-threatening. How is this diagnosed? This condition may be diagnosed based on: A physical exam. Blood tests. A test that measures how much oxygen is in your blood (pulse oximetry). This is done with a sensor that is placed on your finger, toe, or earlobe. Imaging, such as a chest X-ray or CT scan. Tests to check your lung function (pulmonary function tests). A test to check the electrical activity of your heart (electrocardiogram, ECG). You may have other tests to determine the cause of your hypoxia. How is this treated?  Treatment for this  condition depends on what is causing the hypoxia. You will likely be treated with oxygen therapy. This may be done by giving you oxygen through a face mask or through tubes in your nose. Your health care provider may also recommend other therapies to treat the underlying cause of your hypoxia. Follow these instructions at home: Take over-the-counter and prescription medicines only as told by your health care provider. Do not use any products that contain nicotine or tobacco. These products include cigarettes, chewing tobacco, and vaping devices, such as e-cigarettes. If you need help quitting, ask your health care provider. Avoid secondhand smoke. Work with your health care provider to manage any chronic conditions you have that may be causing hypoxia, such as COPD. Keep all follow-up visits. This is important. Contact a health care provider if: You have a fever. You become extremely short of breath when you exercise. Get help right away if: Your shortness of breath gets worse, especially with normal or very little activity. You have trouble breathing, even after treatment. Your skin, lips, or nail beds have a bluish color. You become confused or you cannot think properly. You have chest pain. These symptoms may be an emergency. Get help right away. Call 911. Do not wait to see if the symptoms will go away. Do not drive yourself to the hospital. Summary Hypoxia is a condition that happens when there is a lack of oxygen in the body's tissues and organs. If hypoxia is not treated, it can lead to convulsions, loss of consciousness (coma), or brain damage. Symptoms of hypoxia can include a headache, shortness of breath, confusion, nausea, and a bluish skin color. Hypoxia has many possible causes, including exposure to high altitude, carbon monoxide poisoning, or other health issues, such as blood disorders or   cardiac arrest. Hypoxia is usually treated with oxygen therapy. This information is  not intended to replace advice given to you by your health care provider. Make sure you discuss any questions you have with your health care provider. Document Revised: 11/06/2020 Document Reviewed: 11/06/2020 Elsevier Patient Education  2024 [employer].

## 2023-11-17 NOTE — Progress Notes (Signed)
 SLEEP MEDICINE CLINIC    Provider:  Dedra Gores, MD  Primary Care Physician:  Jaime Michael LABOR, DO [address] [place] [address]     Referring Provider: Federico Norleen ONEIDA Madison, Md [address] [place],  [place] [address]          Chief Complaint according to patient   Patient presents with:     New Patient (Initial Visit)           HISTORY OF PRESENT ILLNESS:  Jaime Michael is a 68 y.o. female patient original from [place] who is seen upon referral  Dr Jaime, on 11/17/2023 for an evaluation of sleep disorders due to Hematoglobin (and platelet elevation).  This  was a finding for the first time in this  years regula  physical exam.  Chief concern according to patient :   checking out for possible hypoxia      Sleep relevant medical history:  none -  BMI 29. No changes.  She mentioned that she had syncopal episodes the last 3 times she flew- within the hour of take off. She going to sleep and wakes up dizzy and then loses awareness.     Family medical /sleep history:  son is snoring with OSA, insomnia, sleep walkers.    Social history:  Recently retired form [employer]. Patient was working as a Garment/textile technologist , recently retired -  and lives in a household with spouse , adult 3 children , 1 grandchild ,  no pets.   Tobacco use; never - .  ETOH use ;  none ,  Caffeine intake in form of Coffee( 1-cup) Soda( /) Tea ( /) or energy drinks Exercise in form of walking 30 minutes daily .   Hobbies :gardening.       Sleep habits are as follows: The patient's dinner time is between 6-7 PM. The patient goes to bed at 10.30-11 PM and continues to sleep for 6-8 hours, and wakes spontaneously at 5 AM.  wakes for one bathroom breaks, the first time at 2 AM.   The preferred sleep position is left side,  avoiding supine sleep because of choking and snoring, with the support of 1 pillow.. Dreams are reportedly rare.  She Michael t feeling refreshed /  restored in AM, with symptoms such as dry mouth but no , morning headaches, and no  fatigue.  Naps are taken infrequently, not  napping.     Referral information  Patient Care Team: Jaime Michael LABOR, DO as PCP - General (Family Medicine) Jaime Michael, Jaime SAUNDERS, MD as Consulting Physician (Cardiology)   Hematological/Oncological History # Polycythemia/Thrombocytosis 08/12/2021: last visit with Dr. Federico 09/28/2023: White blood cell 7.1, hemoglobin 16.1, MCV 89.7, platelets 515    # Decreased Levels of Protein C  07/10/2021: Protein C antigen 36%, protein C activity 30% 08/12/2021: establish care with Dr. Federico    Interval History per Oncology:  Jaime Michael 68 y.o. female with medical history significant for low levels of protein C who presents for a follow up visit. The patient's last visit was on 08/12/2021. In the interim since the last visit she was rereferred due to concern for polycythemia and thrombocytosis.   On exam today Jaime Michael that she has recently started aspirin 81 mg p.o. daily but no other new medications.  She is not having any issues with itching and has had no headache or vision changes.  She denies any shortness of breath or  cough.  She has no signs or symptoms concerning for VTE.  She Michael no inflammatory issues such as joint pain or rash.  She Michael that her energy levels overall have been good she is not having any trouble with daytime sleepiness.  She does report her family is concerned about her heavy snoring.  She is otherwise is not having any bleeding, bruising, or dark stools.   Review of Systems: Out of a complete 14 system review, the patient complains of only the following symptoms, and all other reviewed systems are negative.:  Fatigue, sleepiness , snoring,    How likely are you to doze in the following situations: 0 = not likely, 1 = slight chance, 2 = moderate chance, 3 = high chance   Sitting and Reading? Watching Television? Sitting  inactive in a public place (theater or meeting)? As a passenger in a car for an hour without a break? Lying down in the afternoon when circumstances permit? Sitting and talking to someone? Sitting quietly after lunch without alcohol? In a car, while stopped for a few minutes in traffic?   Total = 7/ 24 points   FSS endorsed at NA/ 63 points.  GDS NA   Social History   Socioeconomic History   Marital status: Married    Spouse name: Jaime Michael   Number of children: 1   Years of education: PhD   Highest education level: Not on file  Occupational History   Occupation: Runner, broadcasting/film/video  Tobacco Use   Smoking status: Never    Passive exposure: Never   Smokeless tobacco: Never  Vaping Use   Vaping status: Never Used  Substance and Sexual Activity   Alcohol use: No   Drug use: No   Sexual activity: Never    Partners: Male    Birth control/protection: None    Comment: Married  Other Topics Concern   Not on file  Social History Narrative   Married to Jaime Michael. One child   PHD/professor   Drinks caffeine, uses herbal remedies.   Wears her seatbelt. Smoke detectors in the home.   Feels safe in her relationships.   Social Drivers of Corporate investment banker Strain: Not on file  Food Insecurity: Not on file  Transportation Needs: Not on file  Physical Activity: Not on file  Stress: Not on file  Social Connections: Not on file    Family History  Problem Relation Age of Onset   Stomach cancer Father    Clotting disorder Brother    Clotting disorder Son    Clotting disorder Cousin        maternal side   Clotting disorder Nephew        maternal side   Colon cancer Neg Hx    Colon polyps Neg Hx    Rectal cancer Neg Hx     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   Chest pain of uncertain etiology 12/19/2021   Encounter for preventive health examination 04/25/2016   Family history of protein C deficiency 07/04/2021    Palpitations 01/27/2017   Pes planus of right foot 10/05/2020   Postmenopausal 05/12/2016   Postural dizziness with presyncope 06/18/2018   Protein C deficiency (HCC)    Protein deficiency (HCC) 07/17/2021   Syncope 12/05/2021   Vitamin D  deficiency 04/28/2016    Past Surgical History:  Procedure Laterality Date   CESAREAN SECTION     COLONOSCOPY  2018   Dr Teressa  DILATION AND CURETTAGE OF UTERUS     X4     Current Outpatient Medications on File Prior to Visit  Medication Sig Dispense Refill   aspirin EC 81 MG tablet Take 81 mg by mouth daily. Swallow whole.     Calcium  Carb-Cholecalciferol 500-400 MG-UNIT CHEW Chew 1 tablet by mouth 2 (two) times daily.      Cholecalciferol (VITAMIN D -3) 5000 UNIT/ML LIQD Place 5,000 Units under the tongue daily.     metoprolol  tartrate (LOPRESSOR ) 25 MG tablet TAKE 1/2 TABLET(12.5 MG) BY MOUTH TWICE DAILY 90 tablet 3   rosuvastatin  (CRESTOR ) 10 MG tablet Take 1 tablet (10 mg total) by mouth daily. 90 tablet 3   No current facility-administered medications on file prior to visit.    No Known Allergies   DIAGNOSTIC DATA (LABS, IMAGING, TESTING) - I reviewed patient records, labs, notes, testing and imaging myself where available.  Lab Results  Component Value Date   WBC 7.5 10/15/2023   HGB 16.8 (H) 10/15/2023   HCT 49.6 (H) 10/15/2023   MCV 88.3 10/15/2023   PLT 556 (H) 10/15/2023      Component Value Date/Time   NA 142 10/15/2023 0750   NA 140 10/13/2023 0807   K 4.1 10/15/2023 0750   CL 108 10/15/2023 0750   CO2 27 10/15/2023 0750   GLUCOSE 111 (H) 10/15/2023 0750   BUN 12 10/15/2023 0750   BUN 12 10/13/2023 0807   CREATININE 1.02 (H) 10/15/2023 0750   CREATININE 0.82 01/26/2017 1518   CALCIUM  9.5 10/15/2023 0750   PROT 7.4 10/15/2023 0750   PROT 6.8 10/13/2023 0807   ALBUMIN 4.2 10/15/2023 0750   ALBUMIN 4.2 10/13/2023 0807   AST 18 10/15/2023 0750   ALT 16 10/15/2023 0750   ALKPHOS 45 10/15/2023 0750   BILITOT 0.7  10/15/2023 0750   GFRNONAA >60 10/15/2023 0750   Lab Results  Component Value Date   CHOL 144 10/13/2023   HDL 68 10/13/2023   LDLCALC 61 10/13/2023   TRIG 75 10/13/2023   CHOLHDL 2.1 10/13/2023   Lab Results  Component Value Date   HGBA1C 5.9 09/18/2023   No results found for: VITAMINB12 Lab Results  Component Value Date   TSH 1.54 09/18/2023    PHYSICAL EXAM:  Today's Vitals   11/17/23 1301  BP: 124/73  Pulse: 64  Weight: 151 lb (68.5 kg)  Height: 5' (1.524 m)   Body mass index is 29.49 kg/m.   Wt Readings from Last 3 Encounters:  11/17/23 151 lb (68.5 kg)  11/09/23 150 lb (68 kg)  10/15/23 152 lb 3.2 oz (69 kg)     Ht Readings from Last 3 Encounters:  11/17/23 5' (1.524 m)  10/06/23 5' (1.524 m)  09/18/23 5' (1.524 m)      General: The patient is awake, alert and appears not in acute distress.  The patient is well groomed. Head: Normocephalic, atraumatic . Neck is supple. Mallampati 3 ,  neck circumference:15 inches . Nasal airflow fully patent.   Retrognathia is  seen.  Dental status: crowded. Biological  Cardiovascular:  Regular rate and cardiac rhythm by pulse,  without distended neck veins. No bruits.  Respiratory: Lungs are clear to auscultation.  Skin:  Without evidence of ankle edema, or rash. Trunk: The patient's posture is erect.   NEUROLOGIC EXAM: The patient is awake and alert, oriented to place and time.   Memory subjective described as intact.  Attention span & concentration ability appears normal.  Speech  is fluent,  without  dysarthria, dysphonia or aphasia.  Mood and affect are appropriate.   Cranial nerves: no loss of smell or taste reported  Pupils are equal and briskly reactive to light.  Funduscopic exam: def.  Extraocular movements in vertical and horizontal planes were intact and without nystagmus. No Diplopia. Beginning clouding.  Visual fields by finger perimetry are intact. Hearing was intact . Facial sensation intact  to fine touch. Facial motor strength is symmetric and tongue and uvula move midline.  Neck ROM : rotation, tilt and flexion extension were normal for age and shoulder shrug was symmetrical.     Motor exam:  Symmetric bulk, tone and ROM.   Normal tone without cog -wheeling, symmetric grip strength .   Sensory:  Fine touch/ vibration intact   Proprioception tested in the upper extremities was normal.   Coordination: Rapid alternating movements in the fingers/hands were of normal speed.  The Finger-to-nose maneuver was performed, showing only left sided  evidence of , dysmetria  Gait and station: Patient could rise unassisted from a seated position, walked without assistive device.  Stance is of normal width/ base and the patient turned with 3 steps ( observed)   Toe and heel walk were deferred.  Deep tendon reflexes: in the  upper and lower extremities are symmetric and intact.  Babinski response was deferred.     ASSESSMENT AND PLAN 68 y.o. year old female  here with:  Snoring     1) High Hemoglobin levels,high RBC levels,  looking for possible  hypoxemia .   2) interesting report of Fainting while  travelling on air planes. New, did only occur the last 3 flights,  within the last 18 months.   3) [place] 2 test  positive.   4) renal cyst 5-4 cm  I plan to follow up either personally or through our NP within 3-5 months if the sleep test result  is abnormal    I would like to thank Jaime, Michael LABOR, DO and Jaime Norleen ONEIDA Madison, Md [address] [place],  [place] [address] for allowing me to meet with and to take care of this pleasant patient.   Discussion of sleep hygiene setting bedtime and rise time,  hot shower  before bed time, no screen light in the bedroom, the bedroom should be cool, quiet and dark. Night lights should illuminate the floor not shine into your eyes. Golden glow  light is less intrusive than blue or cold light.  Read in a book with pages, not on a device.  Consider audio books and soothing  sound -scapes.    After spending a total time of  35  minutes face to face and additional time for physical and neurologic examination, review of laboratory studies,  personal review of imaging studies, Michael and results of other testing and review of referral information / records as far as provided in visit,   Electronically signed by: Jaime Gores, MD 11/17/2023 1:13 PM  [facility] Board certified by The [employer] of Sleep Medicine and Diplomate of the [employer] of Sleep Medicine. Board certified In Neurology through the ABPN, Fellow of the [employer] of Neurology.

## 2023-11-20 DIAGNOSIS — E782 Mixed hyperlipidemia: Secondary | ICD-10-CM | POA: Diagnosis not present

## 2023-11-20 DIAGNOSIS — I700 Atherosclerosis of aorta: Secondary | ICD-10-CM | POA: Diagnosis not present

## 2023-11-20 DIAGNOSIS — R002 Palpitations: Secondary | ICD-10-CM | POA: Diagnosis not present

## 2023-11-20 DIAGNOSIS — I77819 Aortic ectasia, unspecified site: Secondary | ICD-10-CM | POA: Diagnosis not present

## 2023-11-21 LAB — BASIC METABOLIC PANEL WITH GFR
BUN/Creatinine Ratio: 14 (ref 12–28)
BUN: 13 mg/dL (ref 8–27)
CO2: 23 mmol/L (ref 20–29)
Calcium: 9.8 mg/dL (ref 8.7–10.3)
Chloride: 102 mmol/L (ref 96–106)
Creatinine, Ser: 0.91 mg/dL (ref 0.57–1.00)
Glucose: 94 mg/dL (ref 70–99)
Potassium: 4.9 mmol/L (ref 3.5–5.2)
Sodium: 141 mmol/L (ref 134–144)
eGFR: 69 mL/min/1.73 (ref >59)

## 2023-11-21 LAB — LIPID PANEL
Chol/HDL Ratio: 1.9 ratio (ref 0.0–4.4)
Cholesterol, Total: 133 mg/dL (ref 100–199)
HDL: 71 mg/dL (ref >39)
LDL Chol Calc (NIH): 46 mg/dL (ref 0–99)
Triglycerides: 83 mg/dL (ref 0–149)
VLDL Cholesterol Cal: 16 mg/dL (ref 5–40)

## 2023-11-21 LAB — HEPATIC FUNCTION PANEL
ALT: 23 IU/L (ref 0–32)
AST: 24 IU/L (ref 0–40)
Albumin: 4.2 g/dL (ref 3.9–4.9)
Alkaline Phosphatase: 52 IU/L (ref 44–121)
Bilirubin Total: 0.5 mg/dL (ref 0.0–1.2)
Bilirubin, Direct: 0.21 mg/dL (ref 0.00–0.40)
Total Protein: 6.9 g/dL (ref 6.0–8.5)

## 2023-11-26 ENCOUNTER — Ambulatory Visit

## 2023-11-26 DIAGNOSIS — Z1231 Encounter for screening mammogram for malignant neoplasm of breast: Secondary | ICD-10-CM

## 2023-12-08 ENCOUNTER — Ambulatory Visit (INDEPENDENT_AMBULATORY_CARE_PROVIDER_SITE_OTHER): Admitting: Neurology

## 2023-12-08 DIAGNOSIS — D751 Secondary polycythemia: Secondary | ICD-10-CM

## 2023-12-08 DIAGNOSIS — R0683 Snoring: Secondary | ICD-10-CM

## 2023-12-08 DIAGNOSIS — G4733 Obstructive sleep apnea (adult) (pediatric): Secondary | ICD-10-CM

## 2023-12-16 NOTE — Progress Notes (Signed)
 [facility]  Jaime Michael 68 year old female Apr 27, 1955   HOME SLEEP TEST REPORT ( by Watch PAT)   STUDY DATE:  12-11-2023    ORDERING CLINICIAN:  Dedra Gores, MD  REFERRING CLINICIAN:  Dr Federico, hematology-  PCP :Dr Catherine, MD ,Dr. Jennifer , Cardiology    CLINICAL INFORMATION/HISTORY: Jaime Michael is a 68 y.o. female patient original from [place] who is seen upon referral  Dr Federico, on 11/17/2023 for an evaluation of sleep disorders due to # Polycythemia/Thrombocytosis. This  was a new finding  in this year's regular physical exam.  Chief concern according to patient :   checking out for possible hypoxia  Newly discovered polycythemia , rule out hypoxia, OSA as cause. Patient is snoring. Son with OSA. 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  BMI:  29.5 kg/m Neck Circumference: 15   FINDINGS/ Sleep Summary:   Total Recording Time (hours, min):   8 hours 15 minutes  Total Sleep Time (hours, min):      7 hours 48 minutes           Percent REM (%):     20.6%                                   Respiratory Indices:   Calculated pAHI (per CMS guideline): 8.4/h. (  If we follow AASM criteria which are applied for non-Medicare patients the AHI or apnea hypopnea index would be 12.3/h)                         REM pAHI:   10.9/h                                              NREM pAHI: 12.7/h                            Positional AHI:    The patient slept almost exclusively supine with an AHI of 7.5/h in supine sleep, and only AHI of 2.1/h while in non -supine ( right lateral )  sleep position.   Snoring:   Mean volume was 42 db and is moderate, present for 40% of TST.                                              Oxygen Saturation Statistics:   Oxygen Saturation (%) Mean and O2 Saturation Range (%): 93% between a nadir at 88% and maximal 02 sat of 98%                                        O2 Saturation (minutes) <89%: < 1 minute          Pulse Rate Statistics:   Pulse Mean (bpm) was 61 bpm and Pulse Range was between 51 and 96 bpm  IMPRESSION:  This HST confirms the presence of  Mild and all -obstructive sleep apnea , considered mild by CMS and AASM criteria . There was no clinically significant hypoxia present to trigger hemoglobin or erythrocyte production.  The patient would not need to use any CPAP intervention and should avoid supine sleep if possible , this step alone could reduce her AHI significantly.   Snoring can also be treated by a dental device and is usually insurance covered when a condition of OSA is present.    RECOMMENDATION: I recommend a dental device and positional changes in sleep habits.    Any patient should be cautioned not to drive, work at heights, or operate dangerous or heavy equipment when tired or sleepy.   Review of good sleep hygiene measures is accessible to any sleep clinic patient and can be reiterated through online material- I we recommend the Guide to better Sleep   by the NIH.   The referring physician will be notified of the test results.     INTERPRETING PHYSICIAN:  Dedra Gores, MD  [facility] Sleep Board certified by The [employer] of Sleep Medicine and Diplomate of the [employer] of Sleep Medicine. Board certified In Neurology through the ABPN, Fellow of the [employer] of Neurology.

## 2023-12-23 ENCOUNTER — Encounter: Payer: Self-pay | Admitting: Family Medicine

## 2023-12-23 ENCOUNTER — Ambulatory Visit: Payer: Self-pay | Admitting: Neurology

## 2023-12-23 DIAGNOSIS — G4730 Sleep apnea, unspecified: Secondary | ICD-10-CM | POA: Insufficient documentation

## 2023-12-23 NOTE — Telephone Encounter (Signed)
 Called the patient and reviewed the sleep study results. Advised of the findings. She was agreeable to avoiding sleeping on her back. Pt verbalized understanding. Pt had no questions at this time but was encouraged to call back if questions arise.

## 2023-12-23 NOTE — Procedures (Signed)
 [facility]  Jaime Michael 68 year old female [birth year]   HOME SLEEP TEST REPORT ( by Watch PAT)   STUDY DATE:  12-11-2023    ORDERING CLINICIAN:  Dedra Gores, MD  REFERRING CLINICIAN:  Dr Federico, hematology-  PCP :Dr Catherine, MD ,Dr. Jennifer , Cardiology    CLINICAL INFORMATION/HISTORY: Jaime Michael is a 68 y.o. female patient original from [place] who is seen upon referral  Dr Federico, on 11/17/2023 for an evaluation of sleep disorders due to # Polycythemia/Thrombocytosis. This  was a new finding  in this year's regular physical exam.  Chief concern according to patient :   checking out for possible hypoxia  Newly discovered polycythemia , rule out hypoxia, OSA as cause. Patient is snoring. Son with OSA. 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  BMI:  29.5 kg/m Neck Circumference: 15   FINDINGS/ Sleep Summary:   Total Recording Time (hours, min):   8 hours 15 minutes  Total Sleep Time (hours, min):      7 hours 48 minutes           Percent REM (%):     20.6%                                   Respiratory Indices:   Calculated pAHI (per CMS guideline): 8.4/h. (  If we follow AASM criteria which are applied for non-Medicare patients the AHI or apnea hypopnea index would be 12.3/h)                         REM pAHI:   10.9/h                                              NREM pAHI: 12.7/h                            Positional AHI:    The patient slept almost exclusively supine with an AHI of 7.5/h in supine sleep, and only AHI of 2.1/h while in non -supine ( right lateral )  sleep position.   Snoring:   Mean volume was 42 db and is moderate, present for 40% of TST.                                              Oxygen Saturation Statistics:   Oxygen Saturation (%) Mean and O2 Saturation Range (%): 93% between a nadir at 88% and maximal 02 sat of 98%                                        O2 Saturation (minutes) <89%: < 1 minute          Pulse Rate Statistics:   Pulse Mean (bpm) was 61 bpm and Pulse Range was between 51 and 96 bpm  IMPRESSION:  This HST confirms the presence of  Mild and all -obstructive sleep apnea , considered mild by CMS and AASM criteria . There was no clinically significant hypoxia present to trigger hemoglobin or erythrocyte production.  The patient would not need to use any CPAP intervention and should avoid supine sleep if possible , this step alone could reduce her AHI significantly.   Snoring can also be treated by a dental device and is usually insurance covered when a condition of OSA is present.    RECOMMENDATION: I recommend a dental device and positional changes in sleep habits.    Any patient should be cautioned not to drive, work at heights, or operate dangerous or heavy equipment when tired or sleepy.   Review of good sleep hygiene measures is accessible to any sleep clinic patient and can be reiterated through online material- I we recommend the Guide to better Sleep   by the NIH.   The referring physician will be notified of the test results.     INTERPRETING PHYSICIAN:  Dedra Gores, MD  [facility] Sleep Board certified by The [employer] of Sleep Medicine and Diplomate of the [employer] of Sleep Medicine. Board certified In Neurology through the ABPN, Fellow of the [employer] of Neurology.

## 2023-12-23 NOTE — Telephone Encounter (Signed)
-----   Message from [place] Dohmeier sent at 12/23/2023  3:07 PM EDT ----- This obstructive Sleep Apnea is very mild and lacks any associated hypoxia-  simply avoiding supine sleep would  be my recommendation to reduce the apnea to less than 5/h.  Snoring can be addressed by a dental device or by mouth tape - please ask for a referral if interested.   CPAP would not be recommended for this mild degree of OSA.  ----- Message ----- From: Chalice Saunas, MD Sent: 12/23/2023   3:04 PM EDT To: Saunas Chalice, MD

## 2024-01-07 ENCOUNTER — Other Ambulatory Visit: Payer: Self-pay | Admitting: Physician Assistant

## 2024-01-07 DIAGNOSIS — D75839 Thrombocytosis, unspecified: Secondary | ICD-10-CM

## 2024-01-08 ENCOUNTER — Inpatient Hospital Stay: Admitting: Hematology and Oncology

## 2024-01-08 ENCOUNTER — Inpatient Hospital Stay

## 2024-01-19 DIAGNOSIS — I700 Atherosclerosis of aorta: Secondary | ICD-10-CM | POA: Diagnosis not present

## 2024-01-19 DIAGNOSIS — D473 Essential (hemorrhagic) thrombocythemia: Secondary | ICD-10-CM | POA: Diagnosis not present

## 2024-01-19 DIAGNOSIS — Z7982 Long term (current) use of aspirin: Secondary | ICD-10-CM | POA: Diagnosis not present

## 2024-01-19 DIAGNOSIS — I719 Aortic aneurysm of unspecified site, without rupture: Secondary | ICD-10-CM | POA: Diagnosis not present

## 2024-01-19 DIAGNOSIS — Z8249 Family history of ischemic heart disease and other diseases of the circulatory system: Secondary | ICD-10-CM | POA: Diagnosis not present

## 2024-01-19 DIAGNOSIS — E663 Overweight: Secondary | ICD-10-CM | POA: Diagnosis not present

## 2024-01-19 DIAGNOSIS — I10 Essential (primary) hypertension: Secondary | ICD-10-CM | POA: Diagnosis not present

## 2024-01-19 DIAGNOSIS — E785 Hyperlipidemia, unspecified: Secondary | ICD-10-CM | POA: Diagnosis not present

## 2024-01-19 DIAGNOSIS — D75839 Thrombocytosis, unspecified: Secondary | ICD-10-CM | POA: Diagnosis not present

## 2024-02-01 NOTE — Telephone Encounter (Signed)
 No further action needed at this time. Results noted in chart, HM modifier updated

## 2024-02-03 ENCOUNTER — Other Ambulatory Visit: Payer: Self-pay | Admitting: Interventional Radiology

## 2024-02-03 ENCOUNTER — Other Ambulatory Visit (HOSPITAL_COMMUNITY): Payer: Self-pay | Admitting: Student

## 2024-02-03 ENCOUNTER — Other Ambulatory Visit: Payer: Self-pay

## 2024-02-03 DIAGNOSIS — Z01818 Encounter for other preprocedural examination: Secondary | ICD-10-CM

## 2024-02-04 ENCOUNTER — Ambulatory Visit (HOSPITAL_COMMUNITY)
Admission: RE | Admit: 2024-02-04 | Discharge: 2024-02-04 | Disposition: A | Discharge status: Home / Self Care | Source: Ambulatory Visit | Attending: Physician Assistant | Admitting: Physician Assistant

## 2024-02-04 ENCOUNTER — Encounter (HOSPITAL_COMMUNITY): Payer: Self-pay

## 2024-02-04 DIAGNOSIS — Z01812 Encounter for preprocedural laboratory examination: Secondary | ICD-10-CM | POA: Diagnosis not present

## 2024-02-04 DIAGNOSIS — D75839 Thrombocytosis, unspecified: Secondary | ICD-10-CM | POA: Diagnosis not present

## 2024-02-04 DIAGNOSIS — Z01818 Encounter for other preprocedural examination: Secondary | ICD-10-CM

## 2024-02-04 DIAGNOSIS — D751 Secondary polycythemia: Secondary | ICD-10-CM | POA: Insufficient documentation

## 2024-02-04 LAB — CBC WITH DIFFERENTIAL/PLATELET
Abs Immature Granulocytes: 0.02 K/uL (ref 0.00–0.07)
Basophils Absolute: 0.2 K/uL — ABNORMAL HIGH (ref 0.0–0.1)
Basophils Relative: 2 %
Eosinophils Absolute: 0.4 K/uL (ref 0.0–0.5)
Eosinophils Relative: 5 %
HCT: 48.8 % — ABNORMAL HIGH (ref 36.0–46.0)
Hemoglobin: 15.2 g/dL — ABNORMAL HIGH (ref 12.0–15.0)
Immature Granulocytes: 0 %
Lymphocytes Relative: 31 %
Lymphs Abs: 2.5 K/uL (ref 0.7–4.0)
MCH: 28.7 pg (ref 26.0–34.0)
MCHC: 31.1 g/dL (ref 30.0–36.0)
MCV: 92.2 fL (ref 80.0–100.0)
Monocytes Absolute: 0.8 K/uL (ref 0.1–1.0)
Monocytes Relative: 10 %
Neutro Abs: 4.2 K/uL (ref 1.7–7.7)
Neutrophils Relative %: 52 %
Platelets: 501 K/uL — ABNORMAL HIGH (ref 150–400)
RBC: 5.29 MIL/uL — ABNORMAL HIGH (ref 3.87–5.11)
RDW: 14.8 % (ref 11.5–15.5)
WBC: 7.9 K/uL (ref 4.0–10.5)
nRBC: 0 % (ref 0.0–0.2)

## 2024-02-04 MED ORDER — MIDAZOLAM HCL 2 MG/2ML IJ SOLN
INTRAMUSCULAR | Status: AC
  Filled 2024-02-04: qty 2

## 2024-02-04 MED ORDER — MIDAZOLAM HCL (PF) 2 MG/2ML IJ SOLN
INTRAMUSCULAR | Status: AC | PRN
  Administered 2024-02-04: .5 mg via INTRAVENOUS
  Administered 2024-02-04: 1 mg via INTRAVENOUS

## 2024-02-04 MED ORDER — SODIUM CHLORIDE 0.9 % IV SOLN: INTRAVENOUS | Status: DC

## 2024-02-04 MED ORDER — FENTANYL CITRATE (PF) 100 MCG/2ML IJ SOLN
INTRAMUSCULAR | Status: AC | PRN
  Administered 2024-02-04: 50 ug via INTRAVENOUS
  Administered 2024-02-04: 25 ug via INTRAVENOUS

## 2024-02-04 MED ORDER — FENTANYL CITRATE (PF) 100 MCG/2ML IJ SOLN
INTRAMUSCULAR | Status: AC
  Filled 2024-02-04: qty 2

## 2024-02-04 NOTE — Procedures (Signed)
 Pre procedural Dx: JAK2 positive, polycythemia, thrombocytosis   Post procedural Dx: Same  Technically successful CT guided biopsy of right iliac bone marrow   EBL: None.   Complications: None immediate.   KANDICE Banner, MD Pager #: [phone]

## 2024-02-04 NOTE — H&P (Signed)
 Chief Complaint: JAK2 positive, polycythemia, thrombocytosis - IR consulted for bone marrow biopsy and aspiration  Referring Provider(s): Jaime Michael ONEIDA DEVONNA   Supervising Physician: Jaime Michael  Patient Status: [facility] - Out-pt  History of Present Illness: Jaime Michael is a 68 y.o. female presenting today for bone marrow biopsy and aspiration. She has been following with Dr. Federico of Heme/onc for low levels of protein C and found to have thrombocytosis. Recent JAK2 came back positive, and patient has now been referred to IR service for biopsy for further diagnostic evaluation.   Today patient without complaint. Has been NPO since midnight.    Patient is Full Code  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   Chest pain of uncertain etiology 12/19/2021   Encounter for preventive health examination 04/25/2016   Family history of protein C deficiency 07/04/2021   Palpitations 01/27/2017   Pes planus of right foot 10/05/2020   Postmenopausal 05/12/2016   Postural dizziness with presyncope 06/18/2018   Protein C deficiency    Protein deficiency 07/17/2021   Syncope 12/05/2021   Vitamin D  deficiency 04/28/2016    Past Surgical History:  Procedure Laterality Date   CESAREAN SECTION     COLONOSCOPY  2018   Dr Teressa   DILATION AND CURETTAGE OF UTERUS     X4    Allergies: Patient has no known allergies.  Medications: Prior to Admission medications   Medication Sig Start Date End Date Taking? Authorizing Provider  aspirin EC 81 MG tablet Take 81 mg by mouth daily. Swallow whole.   Yes [provider]  Calcium  Carb-Cholecalciferol 500-400 MG-UNIT CHEW Chew 1 tablet by mouth 2 (two) times daily.    Yes [provider]  Cholecalciferol (VITAMIN D -3) 5000 UNIT/ML LIQD Place 5,000 Units under the tongue daily.   Yes [provider]  metoprolol  tartrate (LOPRESSOR ) 25 MG tablet TAKE 1/2  TABLET(12.5 MG) BY MOUTH TWICE DAILY 10/15/23  Yes Revankar, Jennifer SAUNDERS, MD  rosuvastatin  (CRESTOR ) 10 MG tablet Take 1 tablet (10 mg total) by mouth daily. 10/06/23 02/04/24 Yes Revankar, Jennifer SAUNDERS, MD     Family History  Problem Relation Age of Onset   Stomach cancer Father    Clotting disorder Brother    Clotting disorder Son    Clotting disorder Cousin        maternal side   Clotting disorder Nephew        maternal side   Colon cancer Neg Hx    Colon polyps Neg Hx    Rectal cancer Neg Hx     Social History   Socioeconomic History   Marital status: Married    Spouse name: Ahamd   Number of children: 1   Years of education: PhD   Highest education level: Not on file  Occupational History   Occupation: Runner, broadcasting/film/video  Tobacco Use   Smoking status: Never    Passive exposure: Never   Smokeless tobacco: Never  Vaping Use   Vaping status: Never Used  Substance and Sexual Activity   Alcohol use: No   Drug use: No   Sexual activity: Never    Partners: Male    Birth control/protection: None    Comment: Married  Other Topics Concern   Not on file  Social History Narrative   Married to [place]. One child   PHD/professor   Drinks caffeine, uses herbal remedies.   Wears her seatbelt. Smoke detectors in the home.  Feels safe in her relationships.   Social Drivers of Corporate investment banker Strain: Not on file  Food Insecurity: Not on file  Transportation Needs: Not on file  Physical Activity: Not on file  Stress: Not on file  Social Connections: Not on file     Review of Systems: A 12 point ROS discussed and pertinent positives are indicated in the HPI above.  All other systems are negative.    Vital Signs: BP 139/80 (BP Location: Right Arm)   Pulse 63   Temp (!) 97.5 F (36.4 C) (Oral)   Resp 15   Ht 5' (1.524 m)   Wt 150 lb (68 kg)   SpO2 97%   BMI 29.29 kg/m   Advance Care Plan: No documents on file  Physical Exam Vitals and nursing note reviewed.   Constitutional:      Appearance: Normal appearance.  HENT:     Mouth/Throat:     Mouth: Mucous membranes are moist.     Pharynx: Oropharynx is clear.  Cardiovascular:     Rate and Rhythm: Normal rate and regular rhythm.  Pulmonary:     Effort: Pulmonary effort is normal.     Breath sounds: Normal breath sounds.  Abdominal:     Palpations: Abdomen is soft.     Tenderness: There is no abdominal tenderness.  Musculoskeletal:     Right lower leg: No edema.     Left lower leg: No edema.  Skin:    General: Skin is warm and dry.  Neurological:     Mental Status: She is alert and oriented to person, place, and time. Mental status is at baseline.     Imaging: No results found.  Labs:  CBC: Recent Labs    09/18/23 0835 09/28/23 0837 10/15/23 0750 02/04/24 0735  WBC 6.7 7.1 7.5 7.9  HGB 16.3* 16.1* 16.8* 15.2*  HCT 49.2* 48.2* 49.6* 48.8*  PLT 522.0* 515.0* 556* 501*    COAGS: No results for input(s): INR, APTT in the last 8760 hours.  BMP: Recent Labs    09/18/23 0835 10/13/23 0807 10/15/23 0750 11/20/23 0821  NA 140 140 142 141  K 4.9 5.1 4.1 4.9  CL 101 102 108 102  CO2 31 24 27 23   GLUCOSE 98 103* 111* 94  BUN 16 12 12 13   CALCIUM  9.8 9.9 9.5 9.8  CREATININE 1.01 1.03* 1.02* 0.91  GFRNONAA  --   --  >60  --     LIVER FUNCTION TESTS: Recent Labs    09/18/23 0835 10/13/23 0807 10/15/23 0750 11/20/23 0821  BILITOT 0.7 0.4 0.7 0.5  AST 20 21 18 24   ALT 19 18 16 23   ALKPHOS 53 54 45 52  PROT 7.2 6.8 7.4 6.9  ALBUMIN 4.3 4.2 4.2 4.2    TUMOR MARKERS: No results for input(s): AFPTM, CEA, CA199, CHROMGRNA in the last 8760 hours.  Assessment and Plan:  Jaime Michael is a 68 y.o. female presenting today for bone marrow biopsy and aspiration. She has been following with Dr. Federico of Heme/onc for low levels of protein C and found to have thrombocytosis. Recent JAK2 came back positive, and patient has now been referred to IR service for  biopsy for further diagnostic evaluation.  Risks and benefits of bone marrow biopsy and aspiration was discussed with the patient and/or patient's family including, but not limited to bleeding, infection, damage to adjacent structures or low yield requiring additional tests.  All of the questions were answered  and there is agreement to proceed.  Consent signed and in chart.   Thank you for allowing our service to participate in Courtnee Myer 's care.  Electronically Signed: Kimble VEAR Clas, PA-C   02/04/2024, 8:57 AM      I spent a total of  30 Minutes   in face to face in clinical consultation, greater than 50% of which was counseling/coordinating care for bone marrow biopsy and aspiration

## 2024-02-04 NOTE — Discharge Instructions (Signed)
 PLEASE CONTACT [facility] [phone] WITH ANY QUESTIONS OR CONCERNS  MAY REMOVE DRESSING AND SHOWER TOMORROW

## 2024-02-08 LAB — SURGICAL PATHOLOGY

## 2024-02-12 ENCOUNTER — Encounter (HOSPITAL_COMMUNITY): Payer: Self-pay | Admitting: Physician Assistant

## 2024-02-12 ENCOUNTER — Inpatient Hospital Stay: Attending: Hematology and Oncology

## 2024-02-12 ENCOUNTER — Inpatient Hospital Stay (HOSPITAL_BASED_OUTPATIENT_CLINIC_OR_DEPARTMENT_OTHER): Admitting: Hematology and Oncology

## 2024-02-12 ENCOUNTER — Other Ambulatory Visit: Payer: Self-pay | Admitting: Hematology and Oncology

## 2024-02-12 VITALS — BP 132/70 | HR 69 | Temp 98.4°F | Resp 13 | Wt 151.1 lb

## 2024-02-12 DIAGNOSIS — Z79899 Other long term (current) drug therapy: Secondary | ICD-10-CM | POA: Diagnosis not present

## 2024-02-12 DIAGNOSIS — D45 Polycythemia vera: Secondary | ICD-10-CM

## 2024-02-12 DIAGNOSIS — D75839 Thrombocytosis, unspecified: Secondary | ICD-10-CM

## 2024-02-12 LAB — CMP (CANCER CENTER ONLY)
ALT: 15 U/L (ref 0–44)
AST: 19 U/L (ref 15–41)
Albumin: 4.3 g/dL (ref 3.5–5.0)
Alkaline Phosphatase: 51 U/L (ref 38–126)
Anion gap: 3 — ABNORMAL LOW (ref 5–15)
BUN: 18 mg/dL (ref 8–23)
CO2: 33 mmol/L — ABNORMAL HIGH (ref 22–32)
Calcium: 9.9 mg/dL (ref 8.9–10.3)
Chloride: 105 mmol/L (ref 98–111)
Creatinine: 1.05 mg/dL — ABNORMAL HIGH (ref 0.44–1.00)
GFR, Estimated: 58 mL/min — ABNORMAL LOW (ref >60)
Glucose, Bld: 107 mg/dL — ABNORMAL HIGH (ref 70–99)
Potassium: 4.8 mmol/L (ref 3.5–5.1)
Sodium: 141 mmol/L (ref 135–145)
Total Bilirubin: 0.4 mg/dL (ref 0.0–1.2)
Total Protein: 7.7 g/dL (ref 6.5–8.1)

## 2024-02-12 LAB — CBC WITH DIFFERENTIAL (CANCER CENTER ONLY)
Abs Immature Granulocytes: 0.01 K/uL (ref 0.00–0.07)
Basophils Absolute: 0.2 K/uL — ABNORMAL HIGH (ref 0.0–0.1)
Basophils Relative: 2 %
Eosinophils Absolute: 0.4 K/uL (ref 0.0–0.5)
Eosinophils Relative: 5 %
HCT: 48.2 % — ABNORMAL HIGH (ref 36.0–46.0)
Hemoglobin: 16.1 g/dL — ABNORMAL HIGH (ref 12.0–15.0)
Immature Granulocytes: 0 %
Lymphocytes Relative: 33 %
Lymphs Abs: 2.8 K/uL (ref 0.7–4.0)
MCH: 29.8 pg (ref 26.0–34.0)
MCHC: 33.4 g/dL (ref 30.0–36.0)
MCV: 89.3 fL (ref 80.0–100.0)
Monocytes Absolute: 0.9 K/uL (ref 0.1–1.0)
Monocytes Relative: 10 %
Neutro Abs: 4.1 K/uL (ref 1.7–7.7)
Neutrophils Relative %: 50 %
Platelet Count: 595 K/uL — ABNORMAL HIGH (ref 150–400)
RBC: 5.4 MIL/uL — ABNORMAL HIGH (ref 3.87–5.11)
RDW: 14.6 % (ref 11.5–15.5)
WBC Count: 8.4 K/uL (ref 4.0–10.5)
nRBC: 0 % (ref 0.0–0.2)

## 2024-02-12 MED ORDER — HYDROXYUREA 500 MG PO CAPS: 500.0000 mg | ORAL_CAPSULE | Freq: Every day | ORAL | 1 refills | Status: DC

## 2024-02-12 NOTE — Progress Notes (Signed)
 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 Federico 09/28/2023: White blood cell 7.1, hemoglobin 16.1, MCV 89.7, platelets 515  10/15/2023: Testing showed JAK2 positivity 02/04/2024: Patient underwent bone marrow biopsy which confirmed MPN, consistent with polycythemia vera  # Decreased Levels of Protein C  07/10/2021: Protein C antigen 36%, protein C activity 30% 08/12/2021: establish care with Dr. Federico   Interval History:  Jaime Michael 68 y.o. female with medical history significant for JAK2 positive polycythemia vera who presents for a follow up visit. The patient's last visit was on 10/15/2023 due to polycythemia.  In the interim since her last visit she tested positive for the JAK2 mutation and underwent a bone marrow biopsy which confirmed polycythemia vera.  On exam today Mrs. Perkovich reports she tolerated her bone marrow biopsy well but reports that she had pain for about 1 week afterward.  She does not have any residual bruising or bleeding.  She notes that otherwise she has been taking her baby aspirin 81 mg p.o. daily as prescribed.  She denies any infectious symptoms such as fevers, chills, sweats, nausea vomiting or diarrhea.  A full 10 point ROS is otherwise negative.  The bulk of our discussion focused on the diagnosis, pulmonary biopsy results, and treatments moving forward.  Details are noted below.  MEDICAL HISTORY:  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   Chest pain of uncertain etiology 12/19/2021   Encounter for preventive health examination 04/25/2016   Family history of protein C deficiency 07/04/2021   Palpitations 01/27/2017   Pes  planus of right foot 10/05/2020   Postmenopausal 05/12/2016   Postural dizziness with presyncope 06/18/2018   Protein C deficiency    Protein deficiency 07/17/2021   Syncope 12/05/2021   Vitamin D  deficiency 04/28/2016    SURGICAL HISTORY: Past Surgical History:  Procedure Laterality Date   CESAREAN SECTION     COLONOSCOPY  2018   Dr Teressa   DILATION AND CURETTAGE OF UTERUS     X4    SOCIAL HISTORY: Social History   Socioeconomic History   Marital status: Married    Spouse name: Ahamd   Number of children: 1   Years of education: PhD   Highest education level: Not on file  Occupational History   Occupation: Runner, broadcasting/film/video  Tobacco Use   Smoking status: Never    Passive exposure: Never   Smokeless tobacco: Never  Vaping Use   Vaping status: Never Used  Substance and Sexual Activity   Alcohol use: No   Drug use: No   Sexual activity: Never    Partners: Male    Birth control/protection: None    Comment: Married  Other Topics Concern   Not on file  Social History Narrative   Married to [place]. One child   PHD/professor   Drinks caffeine, uses herbal remedies.   Wears her seatbelt. Smoke detectors in the home.   Feels safe in her relationships.   Social Drivers of Corporate investment banker Strain: Not on file  Food Insecurity: Not on file  Transportation Needs: Not on file  Physical Activity: Not on file  Stress: Not on file  Social Connections: Not on file  Intimate Partner Violence: Not on file  FAMILY HISTORY: Family History  Problem Relation Age of Onset   Stomach cancer Father    Clotting disorder Brother    Clotting disorder Son    Clotting disorder Cousin        maternal side   Clotting disorder Nephew        maternal side   Colon cancer Neg Hx    Colon polyps Neg Hx    Rectal cancer Neg Hx     ALLERGIES:  has no known allergies.  MEDICATIONS:  Current Outpatient Medications  Medication Sig Dispense Refill   hydroxyurea (HYDREA) 500 MG  capsule Take 1 capsule (500 mg total) by mouth daily. May take with food to minimize GI side effects. 90 capsule 1   aspirin EC 81 MG tablet Take 81 mg by mouth daily. Swallow whole.     Calcium  Carb-Cholecalciferol 500-400 MG-UNIT CHEW Chew 1 tablet by mouth 2 (two) times daily.      Cholecalciferol (VITAMIN D -3) 5000 UNIT/ML LIQD Place 5,000 Units under the tongue daily.     metoprolol  tartrate (LOPRESSOR ) 25 MG tablet TAKE 1/2 TABLET(12.5 MG) BY MOUTH TWICE DAILY 90 tablet 3   rosuvastatin  (CRESTOR ) 10 MG tablet Take 1 tablet (10 mg total) by mouth daily. 90 tablet 3   No current facility-administered medications for this visit.    REVIEW OF SYSTEMS:   Constitutional: ( - ) fevers, ( - )  chills , ( - ) night sweats Eyes: ( - ) blurriness of vision, ( - ) double vision, ( - ) watery eyes Ears, nose, mouth, throat, and face: ( - ) mucositis, ( - ) sore throat Respiratory: ( - ) cough, ( - ) dyspnea, ( - ) wheezes Cardiovascular: ( - ) palpitation, ( - ) chest discomfort, ( - ) lower extremity swelling Gastrointestinal:  ( - ) nausea, ( - ) heartburn, ( - ) change in bowel habits Skin: ( - ) abnormal skin rashes Lymphatics: ( - ) new lymphadenopathy, ( - ) easy bruising Neurological: ( - ) numbness, ( - ) tingling, ( - ) new weaknesses Behavioral/Psych: ( - ) mood change, ( - ) new changes  All other systems were reviewed with the patient and are negative.  PHYSICAL EXAMINATION:  Vitals:   02/12/24 1423  BP: 132/70  Pulse: 69  Resp: 13  Temp: 98.4 F (36.9 C)  SpO2: 100%    Filed Weights   02/12/24 1423  Weight: 151 lb 1.6 oz (68.5 kg)     GENERAL: Well-appearing elderly female, alert, no distress and comfortable SKIN: skin color, texture, turgor are normal, no rashes or significant lesions EYES: conjunctiva are pink and non-injected, sclera clear LUNGS: clear to auscultation and percussion with normal breathing effort HEART: regular rate & rhythm and no murmurs and no  lower extremity edema Musculoskeletal: no cyanosis of digits and no clubbing  PSYCH: alert & oriented x 3, fluent speech NEURO: no focal motor/sensory deficits  LABORATORY DATA:  I have reviewed the data as listed    Latest Ref Rng & Units 02/12/2024    2:10 PM 02/04/2024    7:35 AM 10/15/2023    7:50 AM  CBC  WBC 4.0 - 10.5 K/uL 8.4  7.9  7.5   Hemoglobin 12.0 - 15.0 g/dL 83.8  84.7  83.1   Hematocrit 36.0 - 46.0 % 48.2  48.8  49.6   Platelets 150 - 400 K/uL 595  501  556        Latest  Ref Rng & Units 02/12/2024    2:10 PM 11/20/2023    8:21 AM 10/15/2023    7:50 AM  CMP  Glucose 70 - 99 mg/dL 892  94  888   BUN 8 - 23 mg/dL 18  13  12    Creatinine 0.44 - 1.00 mg/dL 8.94  9.08  8.97   Sodium 135 - 145 mmol/L 141  141  142   Potassium 3.5 - 5.1 mmol/L 4.8  4.9  4.1   Chloride 98 - 111 mmol/L 105  102  108   CO2 22 - 32 mmol/L 33  23  27   Calcium  8.9 - 10.3 mg/dL 9.9  9.8  9.5   Total Protein 6.5 - 8.1 g/dL 7.7  6.9  7.4   Total Bilirubin 0.0 - 1.2 mg/dL 0.4  0.5  0.7   Alkaline Phos 38 - 126 U/L 51  52  45   AST 15 - 41 U/L 19  24  18    ALT 0 - 44 U/L 15  23  16      RADIOGRAPHIC STUDIES: CT BONE MARROW BIOPSY & ASPIRATION Result Date: 02/04/2024 CLINICAL DATA:  JAK2 positive, polycythemia, thrombocytosis. EXAM: CT-guided bone marrow biopsy TECHNIQUE: CT pelvis CONTRAST:  None RADIOPHARMACEUTICALS:  None COMPARISON:  None FINDINGS: The patient was placed in prone position on the CT gantry. Radiopaque markers were placed on the patient's skin and initial imaging of the pelvis was performed. The patient's skin was then prepped and draped in the usual sterile fashion. Moderate sedation was provided for by the nursing staff under my supervision utilizing intravenous Versed and fentanyl. The nurse had no other duties other than monitoring the patient and providing sedation during the procedure. I was present for the entire procedure. 1.5 mg intravenous Versed and 75 mcg intravenous  fentanyl for a total sedation time of 15 minutes. 1% lidocaine was used to infiltrate the skin at the access site prior to a stab incision. Local anesthesia was then used to infiltrate the region of soft tissue from the skin to the right iliac bone. The bone marrow needle was then advanced and imaging demonstrated the needle tip to be in the cortex of the right iliac bone. The bone was then penetrated and a sample was obtained. After the sample was evaluated, approximately 5 mL of heparinized bone marrow sample was obtained by aspiration. A core sample was then obtained. Multiple attempts at sampling was performed in order to get 2 1 cm segments. All needles were then removed from the patient. Sterile dressing was applied. IMPRESSION: Satisfactory core needle biopsy and aspiration of the right iliac bone marrow under CT guidance. Electronically Signed   By: Cordella Banner   On: 02/04/2024 10:47    ASSESSMENT & PLAN Tinea Sebastiani 68 y.o. female with medical history significant for polycythemia vera who presents for follow-up visit.  There are two types of thrombocytosis, essential thrombocytosis and secondary thrombocytosis. Essential thrombocytosis is overproduction of platelets due to a driver mutation. The most common mutation is the JAK2 V617F (50% of cases), but other causes include CALR, MPL, and mutations of unclear significance on NGS. Essential thrombocytosis is a myeloproliferative neoplasm which may require cytoreductive therapy do decrease risk of thrombosis. Secondary thrombocytosis can be caused by low iron levels, increased inflammation,  or splenectomy.  Our workup will focus on determining if there is a secondary cause of this patient's thrombocytosis.    # Polycythemia Vera, JAK2 positive  -- Bone marrow biopsy confirms  patient has a JAK2 positive polycythemia vera. --Continue aspirin 81 mg p.o. daily for thromboprophylaxis. --Patient has mild sleep apnea and lacks any associated  hypoxia.  Evaluated by sleep medicine. --Labs today show white blood cell 7.9, hemoglobin 15.2, HCT 48.8, platelets 501 --Patient is currently above target with her hematocrit at 48.8%.  Target 42%. --Will start cytoreductive therapy with hydroxyurea 500 mg p.o. daily and start every 2 week phlebotomies. --RTC in 6 weeks to assure she is tolerating hydroxyurea phlebotomies well.  No orders of the defined types were placed in this encounter.   All questions were answered. The patient knows to call the clinic with any problems, questions or concerns.  A total of more than 30 minutes were spent on this encounter with face-to-face time and non-face-to-face time, including preparing to see the patient, ordering tests and/or medications, counseling the patient and coordination of care as outlined above.   Norleen IVAR Kidney, MD Department of Hematology/Oncology [facility] Phone: [phone] Pager: [phone] Email: [email or web address]  02/12/2024 4:30 PM

## 2024-02-18 ENCOUNTER — Inpatient Hospital Stay

## 2024-02-18 ENCOUNTER — Other Ambulatory Visit: Payer: Self-pay | Admitting: Hematology and Oncology

## 2024-02-18 VITALS — BP 114/53 | HR 66 | Temp 97.4°F | Resp 16

## 2024-02-18 DIAGNOSIS — R110 Nausea: Secondary | ICD-10-CM

## 2024-02-18 DIAGNOSIS — D45 Polycythemia vera: Secondary | ICD-10-CM

## 2024-02-18 LAB — CBC WITH DIFFERENTIAL (CANCER CENTER ONLY)
Abs Immature Granulocytes: 0.02 K/uL (ref 0.00–0.07)
Basophils Absolute: 0.2 K/uL — ABNORMAL HIGH (ref 0.0–0.1)
Basophils Relative: 2 %
Eosinophils Absolute: 0.3 K/uL (ref 0.0–0.5)
Eosinophils Relative: 3 %
HCT: 47.4 % — ABNORMAL HIGH (ref 36.0–46.0)
Hemoglobin: 16 g/dL — ABNORMAL HIGH (ref 12.0–15.0)
Immature Granulocytes: 0 %
Lymphocytes Relative: 32 %
Lymphs Abs: 2.8 K/uL (ref 0.7–4.0)
MCH: 29.9 pg (ref 26.0–34.0)
MCHC: 33.8 g/dL (ref 30.0–36.0)
MCV: 88.4 fL (ref 80.0–100.0)
Monocytes Absolute: 0.7 K/uL (ref 0.1–1.0)
Monocytes Relative: 8 %
Neutro Abs: 4.9 K/uL (ref 1.7–7.7)
Neutrophils Relative %: 55 %
Platelet Count: 577 K/uL — ABNORMAL HIGH (ref 150–400)
RBC: 5.36 MIL/uL — ABNORMAL HIGH (ref 3.87–5.11)
RDW: 14.6 % (ref 11.5–15.5)
WBC Count: 8.9 K/uL (ref 4.0–10.5)
nRBC: 0 % (ref 0.0–0.2)

## 2024-02-18 LAB — FERRITIN: Ferritin: 126 ng/mL (ref 11–307)

## 2024-02-18 MED ORDER — ONDANSETRON HCL 8 MG PO TABS
4.0000 mg | ORAL_TABLET | Freq: Once | ORAL | Status: AC
  Administered 2024-02-18: 4 mg via ORAL

## 2024-02-18 MED ORDER — SODIUM CHLORIDE 0.9 % IV SOLN: Freq: Once | INTRAVENOUS | Status: DC

## 2024-02-18 NOTE — Progress Notes (Signed)
 Jaime Michael presents today for phlebotomy per MD orders. RN went over phlebotomy procedure with patient. Patient states that she had had something to eat prior to coming to appt. Patient agreed to proceed with phlebotomy with no questions. Phlebotomy procedure started at 1522 and ended at 1527. 16 ga phlebotomy kit was used to the L [facility]. IV needle removed intact.  During phlebotomy, RN noticed that patient's demeanor was changing, pt stated that she felt fine and did not want RN to discontinue phlebotomy. At that time, the blood had slowed down and RN made decision to discontinue phlebotomy. 186 grams removed. While RN was holding pressure on site, pt stated I'm going, her head tilted back. Pt did not lose consciousness, was yelling out and swinging arms and legs while IV was being placed and vital signs being taken.  This RN called out for help, VS was initiated and an IV was started. IVF started. Dr Federico was contacted and presented to the infusion suite.  See flowsheets for vital signs.  Per Dr Federico, give 1 L NS, recheck vital signs and touch base with him before discharge.  During fluid administration, pt complained of continued dizziness and nausea. RN repositioned patient and offered patient a beverage, of which she declined. Patient requested husband be brought back in to the infusion suite and updated on status. Husband presented to infusion, this RN informed pt's spouse of above noted information, he understood and remained with patient. Order for 4mg  PO Zofran  per Dr Federico, RN administered (see MAR).  At 1650, pt continued to c/o nausea but stated that she was starting to feel a little better, RN spoke with Dr Federico. Per Dr Federico, okay to discharge patient home, we will cancel all scheduled phlebotomies. When RN returned to inform pt of Dr Lafonda instruction, she stated that she was now feeling worse again and more nauseous. Patient was given PB crackers, instructed she could stay  for monitoring x 30 minutes; if no improvement pt instructed to present to ED for further workup.

## 2024-02-18 NOTE — Progress Notes (Signed)
 CBG 114 at 1538.

## 2024-02-18 NOTE — Patient Instructions (Signed)

## 2024-02-19 LAB — GLUCOSE, CAPILLARY: Glucose-Capillary: 114 mg/dL — ABNORMAL HIGH (ref 70–99)

## 2024-02-24 ENCOUNTER — Ambulatory Visit

## 2024-02-24 VITALS — Ht 60.0 in | Wt 151.0 lb

## 2024-02-24 DIAGNOSIS — Z0000 Encounter for general adult medical examination without abnormal findings: Secondary | ICD-10-CM

## 2024-02-24 NOTE — Patient Instructions (Signed)
 Ms. Jaime Michael,  Thank you for taking the time for your Medicare Wellness Visit. I appreciate your continued commitment to your health goals. Please review the care plan we discussed, and feel free to reach out if I can assist you further.  Please note that Annual Wellness Visits do not include a physical exam. Some assessments may be limited, especially if the visit was conducted virtually. If needed, we may recommend an in-person follow-up with your provider.  Ongoing Care Seeing your primary care provider every 3 to 6 months helps us  monitor your health and provide consistent, personalized care. Keep up the good work.  Referrals If a referral was made during today's visit and you haven't received any updates within two weeks, please contact the referred provider directly to check on the status.  Recommended Screenings:  Health Maintenance  Topic Date Due   Breast Cancer Screening  11/25/2024   Medicare Annual Wellness Visit  02/23/2025   DTaP/Tdap/Td vaccine (2 - Td or Tdap) 04/25/2026   Colon Cancer Screening  12/10/2031   DEXA scan (bone density measurement)  10/20/2033   Pneumococcal Vaccine for age over 28  Completed   Flu Shot  Completed   Hepatitis C Screening  Completed   Zoster (Shingles) Vaccine  Completed   Meningitis B Vaccine  Aged Out   COVID-19 Vaccine  Discontinued       02/24/2024    1:40 PM  Advanced Directives  Does Patient Have a Medical Advance Directive? No  Would patient like information on creating a medical advance directive? No - Patient declined    Vision: Annual vision screenings are recommended for early detection of glaucoma, cataracts, and diabetic retinopathy. These exams can also reveal signs of chronic conditions such as diabetes and high blood pressure.  Dental: Annual dental screenings help detect early signs of oral cancer, gum disease, and other conditions linked to overall health, including heart disease and diabetes.  Please see the  attached documents for additional preventive care recommendations.

## 2024-02-24 NOTE — Progress Notes (Addendum)
 Subjective:   Jaime Michael is a 68 y.o. female who presents for a Medicare Annual Wellness Visit.  Visit Complete: Virtual I connected with this patient by a audio enabled telemedicine application and verified that I am speaking with the correct person using two identifiers.  Patient Location: Home Provider Location: Office/Clinic or Home Office HOME  I discussed the limitations of evaluation and management by telemedicine. The patient expressed understanding and agreed to proceed.  Persons Participating in Visit: Patient   Allergies (verified) Patient has no known allergies.   History: 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   Chest pain of uncertain etiology 12/19/2021   Encounter for preventive health examination 04/25/2016   Family history of protein C deficiency 07/04/2021   Palpitations 01/27/2017   Pes planus of right foot 10/05/2020   Postmenopausal 05/12/2016   Postural dizziness with presyncope 06/18/2018   Protein C deficiency    Protein deficiency 07/17/2021   Syncope 12/05/2021   Vitamin D  deficiency 04/28/2016   Past Surgical History:  Procedure Laterality Date   CESAREAN SECTION     COLONOSCOPY  2018   Dr Teressa   DILATION AND CURETTAGE OF UTERUS     X4   Family History  Problem Relation Age of Onset   Stomach cancer Father    Clotting disorder Brother    Clotting disorder Son    Clotting disorder Cousin        maternal side   Clotting disorder Nephew        maternal side   Colon cancer Neg Hx    Colon polyps Neg Hx    Rectal cancer Neg Hx    Social History   Occupational History   Occupation: Runner, Broadcasting/film/video   Occupation: RETIRED  Tobacco Use   Smoking status: Never    Passive exposure: Never   Smokeless tobacco: Never  Vaping Use   Vaping status: Never Used  Substance and Sexual Activity   Alcohol use: No   Drug use: No   Sexual activity: Never    Partners: Male    Birth  control/protection: None    Comment: Married   Tobacco Counseling Counseling given: Not Answered  SDOH Screenings   Food Insecurity: No Food Insecurity (02/24/2024)  Housing: Unknown (02/24/2024)  Transportation Needs: No Transportation Needs (02/24/2024)  Utilities: Not At Risk (02/24/2024)  Depression (PHQ2-9): Low Risk  (02/24/2024)  Physical Activity: Sufficiently Active (02/24/2024)  Social Connections: Moderately Isolated (02/24/2024)  Stress: No Stress Concern Present (02/24/2024)  Tobacco Use: Low Risk  (02/24/2024)  Health Literacy: Adequate Health Literacy (02/24/2024)   Depression Screen    02/24/2024    1:44 PM 02/18/2024    3:13 PM 10/15/2023    8:35 AM 09/18/2023    8:35 AM 10/09/2021    9:45 AM 07/10/2021    9:35 AM 10/05/2020    8:09 AM  PHQ 2/9 Scores  PHQ - 2 Score 0 0 0 0 0 0 0  PHQ- 9 Score 0    0         Data saved with a previous flowsheet row definition     Goals Addressed   None    Visit info / Clinical Intake: Medicare Wellness Visit Type:: Subsequent Annual Wellness Visit; Initial Annual Wellness Visit Medicare Wellness Visit Mode:: Telephone If telephone:: video declined Because this visit was a virtual/telehealth visit:: vitals recorded from last visit Interpreter Needed?: No Pre-visit prep was completed: no AWV questionnaire  completed by patient prior to visit?: no Living arrangements:: lives with spouse/significant other Patient's Overall Health Status Rating: good Typical amount of pain: none Does pain affect daily life?: no Are you currently prescribed opioids?: no  Dietary Habits and Nutritional Risks How many meals a day?: 2 Eats fruit and vegetables daily?: yes Most meals are obtained by: preparing own meals Diabetic:: no  Functional Status Activities of Daily Living (to include ambulation/medication): Independent Ambulation: Independent Medication Administration: Independent Home Management: Independent Manage your own finances?:  yes Primary transportation is: driving Concerns about vision?: no *vision screening is required for WTM* Concerns about hearing?: no  Fall Screening Falls in the past year?: 1 Number of falls in past year: 0 Was there an injury with Fall?: 0 Fall Risk Category Calculator: 1 Patient Fall Risk Level: Low Fall Risk  Fall Risk Patient at Risk for Falls Due to: No Fall Risks Fall risk Follow up: Falls evaluation completed; Falls prevention discussed  Home and Transportation Safety: All rugs have non-skid backing?: N/A, no rugs All stairs or steps have railings?: yes Grab bars in the bathtub or shower?: yes Have non-skid surface in bathtub or shower?: yes Good home lighting?: yes Regular seat belt use?: yes Hospital stays in the last year:: no  Cognitive Assessment Difficulty concentrating, remembering, or making decisions? : no Will 6CIT or Mini Cog be Completed: no 6CIT or Mini Cog Declined: patient alert, oriented, able to answer questions appropriately and recall recent events  Advance Directives (For Healthcare) Does Patient Have a Medical Advance Directive?: No Would patient like information on creating a medical advance directive?: No - Patient declined  Reviewed/Updated  Reviewed/Updated: All        Objective:    Today's Vitals   02/24/24 1337  Weight: 151 lb (68.5 kg)  Height: 5' (1.524 m)   Body mass index is 29.49 kg/m.  Current Medications (verified) Outpatient Encounter Medications as of 02/24/2024  Medication Sig   aspirin EC 81 MG tablet Take 81 mg by mouth daily. Swallow whole.   Calcium  Carb-Cholecalciferol 500-400 MG-UNIT CHEW Chew 1 tablet by mouth 2 (two) times daily.    Cholecalciferol (VITAMIN D -3) 5000 UNIT/ML LIQD Place 5,000 Units under the tongue daily.   hydroxyurea (HYDREA) 500 MG capsule Take 1 capsule (500 mg total) by mouth daily. May take with food to minimize GI side effects.   metoprolol  tartrate (LOPRESSOR ) 25 MG tablet TAKE 1/2  TABLET(12.5 MG) BY MOUTH TWICE DAILY   rosuvastatin  (CRESTOR ) 10 MG tablet Take 1 tablet (10 mg total) by mouth daily.   No facility-administered encounter medications on file as of 02/24/2024.   Hearing/Vision screen Hearing Screening - Comments:: Denies hearing difficulties   Vision Screening - Comments:: Denies vision issues./Does not have an eye care provider  Immunizations and Health Maintenance Health Maintenance  Topic Date Due   Mammogram  11/25/2024   Medicare Annual Wellness (AWV)  02/23/2025   DTaP/Tdap/Td (2 - Td or Tdap) 04/25/2026   Colonoscopy  12/10/2031   DEXA SCAN  10/20/2033   Pneumococcal Vaccine: 50+ Years  Completed   Influenza Vaccine  Completed   Hepatitis C Screening  Completed   Zoster Vaccines- Shingrix  Completed   Meningococcal B Vaccine  Aged Out   COVID-19 Vaccine  Discontinued        Assessment/Plan:  This is a routine wellness examination for [place].  Patient Care Team: Catherine Charlies LABOR, DO as PCP - General (Family Medicine) Revankar, Jennifer SAUNDERS, MD as Consulting Physician (  Cardiology)  I have personally reviewed and noted the following in the patient's chart:   Medical and social history Use of alcohol, tobacco or illicit drugs  Current medications and supplements including opioid prescriptions. Functional ability and status Nutritional status Physical activity Advanced directives List of other physicians Hospitalizations, surgeries, and ER visits in previous 12 months Vitals Screenings to include cognitive, depression, and falls Referrals and appointments  No orders of the defined types were placed in this encounter.  In addition, I have reviewed and discussed with patient certain preventive protocols, quality metrics, and best practice recommendations. A written personalized care plan for preventive services as well as general preventive health recommendations were provided to patient.   Selin Eisler L Kemontae Dunklee, CMA   02/29/2024   Return  in 1 year (on 02/23/2025).  After Visit Summary: (MyChart) Due to this being a telephonic visit, the after visit summary with patients personalized plan was offered to patient via MyChart   Nurse Notes:Patient is up to date on all health maintenance with no concerns to address today.

## 2024-02-26 NOTE — Addendum Note (Signed)
 Addended by: Leida Luton L on: 02/26/2024 01:29 PM   Modules accepted: Orders

## 2024-02-29 NOTE — Addendum Note (Signed)
 Addended by: Layden Caterino L on: 02/29/2024 12:08 PM   Modules accepted: Orders, Level of Service

## 2024-03-03 NOTE — Addendum Note (Signed)
 Addended by: Ivah Girardot L on: 03/03/2024 04:28 PM   Modules accepted: Level of Service

## 2024-03-04 ENCOUNTER — Inpatient Hospital Stay

## 2024-03-17 ENCOUNTER — Other Ambulatory Visit: Payer: Self-pay | Admitting: Physician Assistant

## 2024-03-17 DIAGNOSIS — D45 Polycythemia vera: Secondary | ICD-10-CM

## 2024-03-17 NOTE — Progress Notes (Signed)
 [facility] [phone]   [phone]  PROGRESS NOTE  Patient Care Team: Catherine Charlies LABOR, DO as PCP - General (Family Medicine) Revankar, Jennifer SAUNDERS, MD as Consulting Physician (Cardiology)  Hematological/Oncological History # JAK2 positive polycythemia vera 08/12/2021: last visit with Dr. Federico 09/28/2023: White blood cell 7.1, hemoglobin 16.1, MCV 89.7, platelets 515  10/15/2023: Testing showed JAK2 positivity 02/04/2024: Patient underwent bone marrow biopsy which confirmed MPN, consistent with polycythemia vera  # Decreased Levels of Protein C  07/10/2021: Protein C antigen 36%, protein C activity 30% 08/12/2021: establish care with Dr. Federico   Interval History:  Shital Valiente 68 y.o. female with medical history significant for JAK2 positive polycythemia vera who presents for a follow up visit. She is accompanied by her daughter for this visit.   On exam today Mrs. Vrba reports she is tolerating Hydrea  therapy without any new or concerning side effects.  Her energy and appetite are unchanged.  She experienced a nausea the first week with Hydrea  therapy which is resolved.  She is no longer having nausea. In addition, she denies vomiting or any bowel habit changes. She denies easy bruising or overt signs of bleeding including hematochezia or melena.  She denies fevers, chills, night sweats, shortness of breath, chest pain or cough.  She has no other complaints.  Visit 10 point ROS as below.   MEDICAL HISTORY:  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   Chest pain of uncertain etiology 12/19/2021   Encounter for preventive health examination 04/25/2016   Family history of protein C deficiency 07/04/2021   Palpitations 01/27/2017   Pes planus of right foot 10/05/2020   Postmenopausal 05/12/2016   Postural dizziness with presyncope 06/18/2018   Protein C deficiency    Protein deficiency 07/17/2021    Syncope 12/05/2021   Vitamin D  deficiency 04/28/2016    SURGICAL HISTORY: Past Surgical History:  Procedure Laterality Date   CESAREAN SECTION     COLONOSCOPY  2018   Dr Teressa   DILATION AND CURETTAGE OF UTERUS     X4    SOCIAL HISTORY: Social History   Socioeconomic History   Marital status: Married    Spouse name: Ahamd   Number of children: 1   Years of education: PhD   Highest education level: Not on file  Occupational History   Occupation: Runner, Broadcasting/film/video   Occupation: RETIRED  Tobacco Use   Smoking status: Never    Passive exposure: Never   Smokeless tobacco: Never  Vaping Use   Vaping status: Never Used  Substance and Sexual Activity   Alcohol use: No   Drug use: No   Sexual activity: Never    Partners: Male    Birth control/protection: None    Comment: Married  Other Topics Concern   Not on file  Social History Narrative   Married to [place].Lives with husband One child/2025   PHD/professor   Drinks caffeine, uses herbal remedies.   Wears her seatbelt. Smoke detectors in the home.   Feels safe in her relationships.   Social Drivers of Corporate Investment Banker Strain: Not on file  Food Insecurity: No Food Insecurity (02/24/2024)   Hunger Vital Sign    Worried About Running Out of Food in the Last Year: Never true    Ran Out of Food in the Last Year: Never true  Transportation Needs: No Transportation Needs (02/24/2024)   PRAPARE - Transportation    Lack  of Transportation (Medical): No    Lack of Transportation (Non-Medical): No  Physical Activity: Sufficiently Active (02/24/2024)   Exercise Vital Sign    Days of Exercise per Week: 5 days    Minutes of Exercise per Session: 30 min  Stress: No Stress Concern Present (02/24/2024)   [employer] of Occupational Health - Occupational Stress Questionnaire    Feeling of Stress: Not at all  Social Connections: Moderately Isolated (02/24/2024)   Social Connection and Isolation Panel    Frequency of  Communication with Friends and Family: More than three times a week    Frequency of Social Gatherings with Friends and Family: Once a week    Attends Religious Services: Never    Database Administrator or Organizations: No    Attends Banker Meetings: Never    Marital Status: Married  Catering Manager Violence: Not At Risk (02/24/2024)   Humiliation, Afraid, Rape, and Kick questionnaire    Fear of Current or Ex-Partner: No    Emotionally Abused: No    Physically Abused: No    Sexually Abused: No    FAMILY HISTORY: Family History  Problem Relation Age of Onset   Stomach cancer Father    Clotting disorder Brother    Clotting disorder Son    Clotting disorder Cousin        maternal side   Clotting disorder Nephew        maternal side   Colon cancer Neg Hx    Colon polyps Neg Hx    Rectal cancer Neg Hx     ALLERGIES:  has no known allergies.  MEDICATIONS:  Current Outpatient Medications  Medication Sig Dispense Refill   aspirin EC 81 MG tablet Take 81 mg by mouth daily. Swallow whole.     Calcium  Carb-Cholecalciferol 500-400 MG-UNIT CHEW Chew 1 tablet by mouth 2 (two) times daily.      Cholecalciferol (VITAMIN D -3) 5000 UNIT/ML LIQD Place 5,000 Units under the tongue daily.     hydroxyurea  (HYDREA ) 500 MG capsule Take 1 capsule (500 mg total) by mouth daily. May take with food to minimize GI side effects. 90 capsule 1   metoprolol  tartrate (LOPRESSOR ) 25 MG tablet TAKE 1/2 TABLET(12.5 MG) BY MOUTH TWICE DAILY 90 tablet 3   rosuvastatin  (CRESTOR ) 10 MG tablet Take 1 tablet (10 mg total) by mouth daily. 90 tablet 3   No current facility-administered medications for this visit.    REVIEW OF SYSTEMS:   Constitutional: ( - ) fevers, ( - )  chills , ( - ) night sweats Eyes: ( - ) blurriness of vision, ( - ) double vision, ( - ) watery eyes Ears, nose, mouth, throat, and face: ( - ) mucositis, ( - ) sore throat Respiratory: ( - ) cough, ( - ) dyspnea, ( - )  wheezes Cardiovascular: ( - ) palpitation, ( - ) chest discomfort, ( - ) lower extremity swelling Gastrointestinal:  ( - ) nausea, ( - ) heartburn, ( - ) change in bowel habits Skin: ( - ) abnormal skin rashes Lymphatics: ( - ) new lymphadenopathy, ( - ) easy bruising Neurological: ( - ) numbness, ( - ) tingling, ( - ) new weaknesses Behavioral/Psych: ( - ) mood change, ( - ) new changes  All other systems were reviewed with the patient and are negative.  PHYSICAL EXAMINATION:  Vitals:   03/18/24 0854  BP: 124/77  Pulse: 63  Resp: 17  Temp: 97.6 F (36.4 C)  SpO2: 98%     Filed Weights   03/18/24 0854  Weight: 152 lb (68.9 kg)      GENERAL: Well-appearing elderly female, alert, no distress and comfortable SKIN: skin color, texture, turgor are normal, no rashes or significant lesions EYES: conjunctiva are pink and non-injected, sclera clear LUNGS: clear to auscultation and percussion with normal breathing effort HEART: regular rate & rhythm and no murmurs and no lower extremity edema Musculoskeletal: no cyanosis of digits and no clubbing  PSYCH: alert & oriented x 3, fluent speech NEURO: no focal motor/sensory deficits  LABORATORY DATA:  I have reviewed the data as listed    Latest Ref Rng & Units 03/18/2024    8:33 AM 02/18/2024    2:49 PM 02/12/2024    2:10 PM  CBC  WBC 4.0 - 10.5 K/uL 6.6  8.9  8.4   Hemoglobin 12.0 - 15.0 g/dL 84.6  83.9  83.8   Hematocrit 36.0 - 46.0 % 46.3  47.4  48.2   Platelets 150 - 400 K/uL 424  577  595        Latest Ref Rng & Units 03/18/2024    8:33 AM 02/12/2024    2:10 PM 11/20/2023    8:21 AM  CMP  Glucose 70 - 99 mg/dL 79  892  94   BUN 8 - 23 mg/dL 18  18  13    Creatinine 0.44 - 1.00 mg/dL 9.02  8.94  9.08   Sodium 135 - 145 mmol/L 142  141  141   Potassium 3.5 - 5.1 mmol/L 4.2  4.8  4.9   Chloride 98 - 111 mmol/L 107  105  102   CO2 22 - 32 mmol/L 26  33  23   Calcium  8.9 - 10.3 mg/dL 9.6  9.9  9.8   Total Protein 6.5  - 8.1 g/dL 7.2  7.7  6.9   Total Bilirubin 0.0 - 1.2 mg/dL 0.4  0.4  0.5   Alkaline Phos 38 - 126 U/L 53  51  52   AST 15 - 41 U/L 25  19  24    ALT 0 - 44 U/L 18  15  23      RADIOGRAPHIC STUDIES: No results found.   ASSESSMENT & PLAN Hailei Nevils 68 y.o. female with medical history significant for polycythemia vera who presents for follow-up visit.  # Polycythemia Vera, JAK2 positive  -- Bone marrow biopsy confirms patient has a JAK2 positive polycythemia vera. --Patient has mild sleep apnea and lacks any associated hypoxia.  Evaluated by sleep medicine. --Labs today show WBC 6.6, Hgb 15.3, Hct 46.3%, Plt 424.  --Target hematocrit is 42%. --Continue cytoreductive therapy with hydroxyurea  500 mg p.o. daily --Continue aspirin 81 mg p.o. daily for thromboprophylaxis. --Patient had a near vagal response with her phlebotomy on 02/18/2024 so we will hold for now. Can reconsider if cytoreductive therapy does not provide adequate response.  --RTC in 6 weeks with repeat labs.  No orders of the defined types were placed in this encounter.   All questions were answered. The patient knows to call the clinic with any problems, questions or concerns.  I have spent a total of 25 minutes minutes of face-to-face and non-face-to-face time, preparing to see the patient, obtaining and/or reviewing separately obtained history, performing a medically appropriate examination, counseling and educating the patient, ordering medications/tests/procedures, referring and communicating with other health care professionals, documenting clinical information in the electronic health record, independently interpreting results and communicating results to the  patient, and care coordination.   Johnston Police PA-C Dept of Hematology and Oncology [facility] Phone: [phone]   03/18/2024 9:19 AM

## 2024-03-18 ENCOUNTER — Inpatient Hospital Stay: Attending: Physician Assistant | Admitting: Physician Assistant

## 2024-03-18 ENCOUNTER — Inpatient Hospital Stay

## 2024-03-18 ENCOUNTER — Encounter: Payer: Self-pay | Admitting: Physician Assistant

## 2024-03-18 ENCOUNTER — Encounter: Payer: Self-pay | Admitting: Hematology and Oncology

## 2024-03-18 VITALS — BP 124/77 | HR 63 | Temp 97.6°F | Resp 17 | Ht 60.0 in | Wt 152.0 lb

## 2024-03-18 DIAGNOSIS — D45 Polycythemia vera: Secondary | ICD-10-CM | POA: Diagnosis not present

## 2024-03-18 DIAGNOSIS — Z800 Family history of malignant neoplasm of digestive organs: Secondary | ICD-10-CM | POA: Diagnosis not present

## 2024-03-18 DIAGNOSIS — G4730 Sleep apnea, unspecified: Secondary | ICD-10-CM | POA: Diagnosis not present

## 2024-03-18 DIAGNOSIS — Z7982 Long term (current) use of aspirin: Secondary | ICD-10-CM | POA: Diagnosis not present

## 2024-03-18 LAB — CMP (CANCER CENTER ONLY)
ALT: 18 U/L (ref 0–44)
AST: 25 U/L (ref 15–41)
Albumin: 4.1 g/dL (ref 3.5–5.0)
Alkaline Phosphatase: 53 U/L (ref 38–126)
Anion gap: 9 (ref 5–15)
BUN: 18 mg/dL (ref 8–23)
CO2: 26 mmol/L (ref 22–32)
Calcium: 9.6 mg/dL (ref 8.9–10.3)
Chloride: 107 mmol/L (ref 98–111)
Creatinine: 0.97 mg/dL (ref 0.44–1.00)
GFR, Estimated: >60 mL/min (ref >60)
Glucose, Bld: 79 mg/dL (ref 70–99)
Potassium: 4.2 mmol/L (ref 3.5–5.1)
Sodium: 142 mmol/L (ref 135–145)
Total Bilirubin: 0.4 mg/dL (ref 0.0–1.2)
Total Protein: 7.2 g/dL (ref 6.5–8.1)

## 2024-03-18 LAB — CBC WITH DIFFERENTIAL (CANCER CENTER ONLY)
Abs Immature Granulocytes: 0.01 K/uL (ref 0.00–0.07)
Basophils Absolute: 0.2 K/uL — ABNORMAL HIGH (ref 0.0–0.1)
Basophils Relative: 3 %
Eosinophils Absolute: 0.3 K/uL (ref 0.0–0.5)
Eosinophils Relative: 4 %
HCT: 46.3 % — ABNORMAL HIGH (ref 36.0–46.0)
Hemoglobin: 15.3 g/dL — ABNORMAL HIGH (ref 12.0–15.0)
Immature Granulocytes: 0 %
Lymphocytes Relative: 35 %
Lymphs Abs: 2.3 K/uL (ref 0.7–4.0)
MCH: 31 pg (ref 26.0–34.0)
MCHC: 33 g/dL (ref 30.0–36.0)
MCV: 93.7 fL (ref 80.0–100.0)
Monocytes Absolute: 0.9 K/uL (ref 0.1–1.0)
Monocytes Relative: 13 %
Neutro Abs: 3 K/uL (ref 1.7–7.7)
Neutrophils Relative %: 45 %
Platelet Count: 424 K/uL — ABNORMAL HIGH (ref 150–400)
RBC: 4.94 MIL/uL (ref 3.87–5.11)
RDW: 16.8 % — ABNORMAL HIGH (ref 11.5–15.5)
WBC Count: 6.6 K/uL (ref 4.0–10.5)
nRBC: 0 % (ref 0.0–0.2)

## 2024-03-18 LAB — FERRITIN: Ferritin: 85 ng/mL (ref 11–307)

## 2024-04-04 ENCOUNTER — Other Ambulatory Visit: Payer: Self-pay

## 2024-04-04 MED ORDER — ROSUVASTATIN CALCIUM 10 MG PO TABS: 10.0000 mg | ORAL_TABLET | Freq: Every day | ORAL | 2 refills | Status: AC

## 2024-04-04 NOTE — Telephone Encounter (Signed)
 Refill sent

## 2024-04-26 NOTE — Progress Notes (Signed)
 " [facility] [phone]   [phone]  PROGRESS NOTE  Patient Care Team: Catherine Charlies LABOR, DO as PCP - General (Family Medicine) Revankar, Jennifer SAUNDERS, MD as Consulting Physician (Cardiology)  Hematological/Oncological History # JAK2 positive polycythemia vera 08/12/2021: last visit with Dr. Federico 09/28/2023: White blood cell 7.1, hemoglobin 16.1, MCV 89.7, platelets 515  10/15/2023: Testing showed JAK2 positivity 02/04/2024: Patient underwent bone marrow biopsy which confirmed MPN, consistent with polycythemia vera  # Decreased Levels of Protein C  07/10/2021: Protein C antigen 36%, protein C activity 30% 08/12/2021: establish care with Dr. Federico   Interval History:  Jaime Michael 69 y.o. female with medical history significant for JAK2 positive polycythemia vera who presents for a follow up visit. She is accompanied by her daughter for this visit.   On exam today Jaime Michael reports she has been faithfully taking her hydroxyurea  once daily as well as her baby aspirin.  She reports she is not having any issues with nausea, vomit, or diarrhea.  She reports no fevers, chills, sweats or new major side effects as result of this treatment.  She is not having any bleeding, bruising, or dark stools.  She reports no changes in her energy.  She is also not having any signs or symptoms concerning for VTE such as leg pain, leg swelling, chest pain, or shortness of breath.  She reports that she overall is feeling quite well with no lightheadedness, dizziness, shortness of breath.  Today she expresses a desire to restart phlebotomies.  She notes that is unsure what happened last time but feels better prepared to start phlebotomies again.  Full 10 point ROS is otherwise negative.   MEDICAL HISTORY:  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   Chest pain of uncertain etiology 12/19/2021   Encounter for preventive health  examination 04/25/2016   Family history of protein C deficiency 07/04/2021   Palpitations 01/27/2017   Pes planus of right foot 10/05/2020   Postmenopausal 05/12/2016   Postural dizziness with presyncope 06/18/2018   Protein C deficiency    Protein deficiency 07/17/2021   Syncope 12/05/2021   Vitamin D  deficiency 04/28/2016    SURGICAL HISTORY: Past Surgical History:  Procedure Laterality Date   CESAREAN SECTION     COLONOSCOPY  2018   Dr Teressa   DILATION AND CURETTAGE OF UTERUS     X4    SOCIAL HISTORY: Social History   Socioeconomic History   Marital status: Married    Spouse name: Ahamd   Number of children: 1   Years of education: PhD   Highest education level: Not on file  Occupational History   Occupation: Runner, Broadcasting/film/video   Occupation: RETIRED  Tobacco Use   Smoking status: Never    Passive exposure: Never   Smokeless tobacco: Never  Vaping Use   Vaping status: Never Used  Substance and Sexual Activity   Alcohol use: No   Drug use: No   Sexual activity: Never    Partners: Male    Birth control/protection: None    Comment: Married  Other Topics Concern   Not on file  Social History Narrative   Married to Florien.Lives with husband One child/2025   PHD/professor   Drinks caffeine, uses herbal remedies.   Wears her seatbelt. Smoke detectors in the home.   Feels safe in her relationships.   Social Drivers of Health   Tobacco Use: Low Risk (02/24/2024)   Patient History  Smoking Tobacco Use: Never    Smokeless Tobacco Use: Never    Passive Exposure: Never  Financial Resource Strain: Not on file  Food Insecurity: No Food Insecurity (02/24/2024)   Epic    Worried About Programme Researcher, Broadcasting/film/video in the Last Year: Never true    Ran Out of Food in the Last Year: Never true  Transportation Needs: No Transportation Needs (02/24/2024)   Epic    Lack of Transportation (Medical): No    Lack of Transportation (Non-Medical): No  Physical Activity: Sufficiently Active  (02/24/2024)   Exercise Vital Sign    Days of Exercise per Week: 5 days    Minutes of Exercise per Session: 30 min  Stress: No Stress Concern Present (02/24/2024)   [employer] of Occupational Health - Occupational Stress Questionnaire    Feeling of Stress: Not at all  Social Connections: Moderately Isolated (02/24/2024)   Social Connection and Isolation Panel    Frequency of Communication with Friends and Family: More than three times a week    Frequency of Social Gatherings with Friends and Family: Once a week    Attends Religious Services: Never    Database Administrator or Organizations: No    Attends Banker Meetings: Never    Marital Status: Married  Catering Manager Violence: Not At Risk (02/24/2024)   Epic    Fear of Current or Ex-Partner: No    Emotionally Abused: No    Physically Abused: No    Sexually Abused: No  Depression (PHQ2-9): Low Risk (04/28/2024)   Depression (PHQ2-9)    PHQ-2 Score: 0  Alcohol Screen: Not on file  Housing: Unknown (02/24/2024)   Epic    Unable to Pay for Housing in the Last Year: No    Number of Times Moved in the Last Year: Not on file    Homeless in the Last Year: No  Utilities: Not At Risk (02/24/2024)   Epic    Threatened with loss of utilities: No  Health Literacy: Adequate Health Literacy (02/24/2024)   B1300 Health Literacy    Frequency of need for help with medical instructions: Never    FAMILY HISTORY: Family History  Problem Relation Age of Onset   Stomach cancer Father    Clotting disorder Brother    Clotting disorder Son    Clotting disorder Cousin        maternal side   Clotting disorder Nephew        maternal side   Colon cancer Neg Hx    Colon polyps Neg Hx    Rectal cancer Neg Hx     ALLERGIES:  has no known allergies.  MEDICATIONS:  Current Outpatient Medications  Medication Sig Dispense Refill   aspirin EC 81 MG tablet Take 81 mg by mouth daily. Swallow whole.     Calcium  Carb-Cholecalciferol  500-400 MG-UNIT CHEW Chew 1 tablet by mouth 2 (two) times daily.      Cholecalciferol (VITAMIN D -3) 5000 UNIT/ML LIQD Place 5,000 Units under the tongue daily.     hydroxyurea  (HYDREA ) 500 MG capsule Take 1 capsule (500 mg total) by mouth daily. May take with food to minimize GI side effects. 90 capsule 1   metoprolol  tartrate (LOPRESSOR ) 25 MG tablet TAKE 1/2 TABLET(12.5 MG) BY MOUTH TWICE DAILY 90 tablet 3   rosuvastatin  (CRESTOR ) 10 MG tablet Take 1 tablet (10 mg total) by mouth daily. 90 tablet 2   No current facility-administered medications for this visit.    REVIEW  OF SYSTEMS:   Constitutional: ( - ) fevers, ( - )  chills , ( - ) night sweats Eyes: ( - ) blurriness of vision, ( - ) double vision, ( - ) watery eyes Ears, nose, mouth, throat, and face: ( - ) mucositis, ( - ) sore throat Respiratory: ( - ) cough, ( - ) dyspnea, ( - ) wheezes Cardiovascular: ( - ) palpitation, ( - ) chest discomfort, ( - ) lower extremity swelling Gastrointestinal:  ( - ) nausea, ( - ) heartburn, ( - ) change in bowel habits Skin: ( - ) abnormal skin rashes Lymphatics: ( - ) new lymphadenopathy, ( - ) easy bruising Neurological: ( - ) numbness, ( - ) tingling, ( - ) new weaknesses Behavioral/Psych: ( - ) mood change, ( - ) new changes  All other systems were reviewed with the patient and are negative.  PHYSICAL EXAMINATION:  Vitals:   04/28/24 0903 04/28/24 0905  BP: (!) 157/68 (!) 148/79  Pulse: 67   Resp: 18   Temp: 97.9 F (36.6 C)   SpO2: 100%       Filed Weights   04/28/24 0903  Weight: 150 lb 11.2 oz (68.4 kg)       GENERAL: Well-appearing elderly female, alert, no distress and comfortable SKIN: skin color, texture, turgor are normal, no rashes or significant lesions EYES: conjunctiva are pink and non-injected, sclera clear LUNGS: clear to auscultation and percussion with normal breathing effort HEART: regular rate & rhythm and no murmurs and no lower extremity  edema Musculoskeletal: no cyanosis of digits and no clubbing  PSYCH: alert & oriented x 3, fluent speech NEURO: no focal motor/sensory deficits  LABORATORY DATA:  I have reviewed the data as listed    Latest Ref Rng & Units 04/28/2024    8:33 AM 03/18/2024    8:33 AM 02/18/2024    2:49 PM  CBC  WBC 4.0 - 10.5 K/uL 6.0  6.6  8.9   Hemoglobin 12.0 - 15.0 g/dL 83.9  84.6  83.9   Hematocrit 36.0 - 46.0 % 47.4  46.3  47.4   Platelets 150 - 400 K/uL 383  424  577        Latest Ref Rng & Units 03/18/2024    8:33 AM 02/12/2024    2:10 PM 11/20/2023    8:21 AM  CMP  Glucose 70 - 99 mg/dL 79  892  94   BUN 8 - 23 mg/dL 18  18  13    Creatinine 0.44 - 1.00 mg/dL 9.02  8.94  9.08   Sodium 135 - 145 mmol/L 142  141  141   Potassium 3.5 - 5.1 mmol/L 4.2  4.8  4.9   Chloride 98 - 111 mmol/L 107  105  102   CO2 22 - 32 mmol/L 26  33  23   Calcium  8.9 - 10.3 mg/dL 9.6  9.9  9.8   Total Protein 6.5 - 8.1 g/dL 7.2  7.7  6.9   Total Bilirubin 0.0 - 1.2 mg/dL 0.4  0.4  0.5   Alkaline Phos 38 - 126 U/L 53  51  52   AST 15 - 41 U/L 25  19  24    ALT 0 - 44 U/L 18  15  23      RADIOGRAPHIC STUDIES: No results found.   ASSESSMENT & PLAN Jaime Michael 69 y.o. female with medical history significant for polycythemia vera who presents for follow-up visit.  # Polycythemia Olena,  JAK2 positive  -- Bone marrow biopsy confirms patient has a JAK2 positive polycythemia vera. --Patient has mild sleep apnea and lacks any associated hypoxia.  Evaluated by sleep medicine. --Labs today show WBC 6.0, Hgb 16.0, MCV 95.2, Plt 383. Hct 47.4%.  --Target hematocrit is 42%. --Continue cytoreductive therapy with hydroxyurea  500 mg p.o. daily. Will increase to 500 mg  BID --Continue aspirin 81 mg p.o. daily for thromboprophylaxis. --Patient had a near vagal response with her phlebotomy on 02/18/2024.  Patient expresses desire to restart phlebotomies. --RTC for phlebotomies next week with continued phlebotomies every 2  weeks and a clinic visit in approximately 5 weeks time.  No orders of the defined types were placed in this encounter.   All questions were answered. The patient knows to call the clinic with any problems, questions or concerns.  I have spent a total of 30 minutes minutes of face-to-face and non-face-to-face time, preparing to see the patient, obtaining and/or reviewing separately obtained history, performing a medically appropriate examination, counseling and educating the patient, ordering medications/tests/procedures, referring and communicating with other health care professionals, documenting clinical information in the electronic health record, independently interpreting results and communicating results to the patient, and care coordination.   Norleen IVAR Kidney, MD Department of Hematology/Oncology [facility] at [facility] Phone: [phone] Pager: [phone] Email: [email or web address]    04/28/2024 9:47 AM  "

## 2024-04-28 ENCOUNTER — Inpatient Hospital Stay: Attending: Physician Assistant

## 2024-04-28 ENCOUNTER — Inpatient Hospital Stay: Admitting: Hematology and Oncology

## 2024-04-28 VITALS — BP 148/79 | HR 67 | Temp 97.9°F | Resp 18 | Wt 150.7 lb

## 2024-04-28 DIAGNOSIS — D45 Polycythemia vera: Secondary | ICD-10-CM

## 2024-04-28 DIAGNOSIS — D75839 Thrombocytosis, unspecified: Secondary | ICD-10-CM

## 2024-04-28 LAB — CBC WITH DIFFERENTIAL (CANCER CENTER ONLY)
Abs Immature Granulocytes: 0 K/uL (ref 0.00–0.07)
Basophils Absolute: 0.1 K/uL (ref 0.0–0.1)
Basophils Relative: 2 %
Eosinophils Absolute: 0.2 K/uL (ref 0.0–0.5)
Eosinophils Relative: 4 %
HCT: 47.4 % — ABNORMAL HIGH (ref 36.0–46.0)
Hemoglobin: 16 g/dL — ABNORMAL HIGH (ref 12.0–15.0)
Immature Granulocytes: 0 %
Lymphocytes Relative: 38 %
Lymphs Abs: 2.3 K/uL (ref 0.7–4.0)
MCH: 32.1 pg (ref 26.0–34.0)
MCHC: 33.8 g/dL (ref 30.0–36.0)
MCV: 95.2 fL (ref 80.0–100.0)
Monocytes Absolute: 0.5 K/uL (ref 0.1–1.0)
Monocytes Relative: 9 %
Neutro Abs: 2.8 K/uL (ref 1.7–7.7)
Neutrophils Relative %: 47 %
Platelet Count: 383 K/uL (ref 150–400)
RBC: 4.98 MIL/uL (ref 3.87–5.11)
RDW: 17.5 % — ABNORMAL HIGH (ref 11.5–15.5)
WBC Count: 6 K/uL (ref 4.0–10.5)
nRBC: 0 % (ref 0.0–0.2)

## 2024-04-28 LAB — FERRITIN: Ferritin: 114 ng/mL (ref 11–307)

## 2024-05-05 ENCOUNTER — Other Ambulatory Visit: Payer: Self-pay

## 2024-05-05 ENCOUNTER — Inpatient Hospital Stay

## 2024-05-05 ENCOUNTER — Telehealth: Payer: Self-pay

## 2024-05-05 VITALS — BP 137/71 | HR 79 | Temp 97.9°F | Resp 16

## 2024-05-05 DIAGNOSIS — D45 Polycythemia vera: Secondary | ICD-10-CM

## 2024-05-05 LAB — CBC WITH DIFFERENTIAL (CANCER CENTER ONLY)
Abs Immature Granulocytes: 0.01 K/uL (ref 0.00–0.07)
Basophils Absolute: 0.1 K/uL (ref 0.0–0.1)
Basophils Relative: 2 %
Eosinophils Absolute: 0.2 K/uL (ref 0.0–0.5)
Eosinophils Relative: 3 %
HCT: 46.3 % — ABNORMAL HIGH (ref 36.0–46.0)
Hemoglobin: 15.8 g/dL — ABNORMAL HIGH (ref 12.0–15.0)
Immature Granulocytes: 0 %
Lymphocytes Relative: 38 %
Lymphs Abs: 2.4 K/uL (ref 0.7–4.0)
MCH: 32.8 pg (ref 26.0–34.0)
MCHC: 34.1 g/dL (ref 30.0–36.0)
MCV: 96.1 fL (ref 80.0–100.0)
Monocytes Absolute: 0.7 K/uL (ref 0.1–1.0)
Monocytes Relative: 11 %
Neutro Abs: 3 K/uL (ref 1.7–7.7)
Neutrophils Relative %: 46 %
Platelet Count: 376 K/uL (ref 150–400)
RBC: 4.82 MIL/uL (ref 3.87–5.11)
RDW: 17.2 % — ABNORMAL HIGH (ref 11.5–15.5)
WBC Count: 6.4 K/uL (ref 4.0–10.5)
nRBC: 0 % (ref 0.0–0.2)

## 2024-05-05 LAB — FERRITIN: Ferritin: 124 ng/mL (ref 11–307)

## 2024-05-05 MED ORDER — LORAZEPAM 1 MG PO TABS: 1.0000 mg | ORAL_TABLET | Freq: Once | ORAL | Status: DC

## 2024-05-05 MED ORDER — HYDROXYUREA 500 MG PO CAPS: 500.0000 mg | ORAL_CAPSULE | Freq: Two times a day (BID) | ORAL | 0 refills | Status: AC

## 2024-05-05 NOTE — Patient Instructions (Signed)

## 2024-05-05 NOTE — Progress Notes (Signed)
 Pt completed post phlebotomy observation with no issues or complaints. VSS and ambulatory to lobby with family.

## 2024-05-05 NOTE — Telephone Encounter (Signed)
 Spoke with pt to inform that her requested refill of Hydrea  has been sent to [facility], [place].  Pt voiced understanding.

## 2024-05-05 NOTE — Progress Notes (Signed)
 Jaime Michael presents today for phlebotomy per MD orders. Phlebotomy procedure started at 16:10 and ended at 18. 16 G phlebotomy kit used in the RAC. However, blood stopped flowing at 80g. Patient was stuck again in the RAC. IV needle removed intact. 511 grams removed in total. Pt was offered peanut butter cookies and water which she ate and drank. Patient agreed to  30 minute post-observation due to syncope episode the last appointment. Tolerated procedure without any incident. Vital signs stable and patient ambulatory to lobby with no complaints.

## 2024-05-19 ENCOUNTER — Inpatient Hospital Stay

## 2024-05-19 VITALS — BP 128/74 | HR 72 | Temp 97.9°F | Resp 16

## 2024-05-19 DIAGNOSIS — D45 Polycythemia vera: Secondary | ICD-10-CM

## 2024-05-19 LAB — CBC WITH DIFFERENTIAL (CANCER CENTER ONLY)
Abs Immature Granulocytes: 0.01 10*3/uL (ref 0.00–0.07)
Basophils Absolute: 0.1 10*3/uL (ref 0.0–0.1)
Basophils Relative: 2 %
Eosinophils Absolute: 0.2 10*3/uL (ref 0.0–0.5)
Eosinophils Relative: 2 %
HCT: 43.3 % (ref 36.0–46.0)
Hemoglobin: 14.8 g/dL (ref 12.0–15.0)
Immature Granulocytes: 0 %
Lymphocytes Relative: 40 %
Lymphs Abs: 2.6 10*3/uL (ref 0.7–4.0)
MCH: 34 pg (ref 26.0–34.0)
MCHC: 34.2 g/dL (ref 30.0–36.0)
MCV: 99.5 fL (ref 80.0–100.0)
Monocytes Absolute: 0.8 10*3/uL (ref 0.1–1.0)
Monocytes Relative: 13 %
Neutro Abs: 2.8 10*3/uL (ref 1.7–7.7)
Neutrophils Relative %: 43 %
Platelet Count: 322 10*3/uL (ref 150–400)
RBC: 4.35 MIL/uL (ref 3.87–5.11)
RDW: 18.8 % — ABNORMAL HIGH (ref 11.5–15.5)
WBC Count: 6.5 10*3/uL (ref 4.0–10.5)
nRBC: 0 % (ref 0.0–0.2)

## 2024-05-19 LAB — FERRITIN: Ferritin: 84 ng/mL (ref 11–307)

## 2024-05-19 NOTE — Progress Notes (Signed)
 Patient here for therapeutic phlebotomy. Her parameters were to be above a hemoglobin of 11- hemoglobin 14.8. 16g needle used in R AC with no issues. Procedure started at 1525 and ended at 1530. Patient observed for 20 minutes after procedure without any incident and declined any further observation. VSS. BP 128/74 (BP Location: Left Arm, Patient Position: Sitting)   Pulse 72   Temp 97.9 F (36.6 C) (Oral)   Resp 16   SpO2 97%   Food and drink offered. Patient tolerated procedure well. IV needle removed intact.  Ambulatory to the lobby.

## 2024-05-19 NOTE — Patient Instructions (Signed)

## 2024-06-02 ENCOUNTER — Inpatient Hospital Stay

## 2024-06-02 ENCOUNTER — Inpatient Hospital Stay: Attending: Physician Assistant | Admitting: Hematology and Oncology
